# Patient Record
Sex: Male | Born: 1960 | Hispanic: Yes | Marital: Married | Smoking: Former smoker
Health system: Southern US, Community
[De-identification: ages and names within clinical notes are randomized; demographics above are authoritative.]

## PROBLEM LIST (undated history)

## (undated) DIAGNOSIS — E785 Hyperlipidemia, unspecified: Secondary | ICD-10-CM

## (undated) DIAGNOSIS — R569 Unspecified convulsions: Secondary | ICD-10-CM

## (undated) DIAGNOSIS — Z5189 Encounter for other specified aftercare: Secondary | ICD-10-CM

## (undated) DIAGNOSIS — K5790 Diverticulosis of intestine, part unspecified, without perforation or abscess without bleeding: Secondary | ICD-10-CM

## (undated) DIAGNOSIS — I2510 Atherosclerotic heart disease of native coronary artery without angina pectoris: Secondary | ICD-10-CM

## (undated) DIAGNOSIS — E669 Obesity, unspecified: Secondary | ICD-10-CM

## (undated) DIAGNOSIS — F411 Generalized anxiety disorder: Secondary | ICD-10-CM

## (undated) DIAGNOSIS — T7840XA Allergy, unspecified, initial encounter: Secondary | ICD-10-CM

## (undated) DIAGNOSIS — R011 Cardiac murmur, unspecified: Secondary | ICD-10-CM

## (undated) DIAGNOSIS — U071 COVID-19: Secondary | ICD-10-CM

## (undated) DIAGNOSIS — K219 Gastro-esophageal reflux disease without esophagitis: Secondary | ICD-10-CM

## (undated) DIAGNOSIS — S83249A Other tear of medial meniscus, current injury, unspecified knee, initial encounter: Secondary | ICD-10-CM

## (undated) DIAGNOSIS — F40240 Claustrophobia: Secondary | ICD-10-CM

## (undated) DIAGNOSIS — R399 Unspecified symptoms and signs involving the genitourinary system: Secondary | ICD-10-CM

## (undated) DIAGNOSIS — R7303 Prediabetes: Secondary | ICD-10-CM

## (undated) DIAGNOSIS — G5600 Carpal tunnel syndrome, unspecified upper limb: Secondary | ICD-10-CM

## (undated) HISTORY — DX: Gastro-esophageal reflux disease without esophagitis: K21.9

## (undated) HISTORY — DX: Generalized anxiety disorder: F41.1

## (undated) HISTORY — DX: Diverticulosis of intestine, part unspecified, without perforation or abscess without bleeding: K57.90

## (undated) HISTORY — DX: Cardiac murmur, unspecified: R01.1

## (undated) HISTORY — DX: Obesity, unspecified: E66.9

## (undated) HISTORY — DX: Unspecified convulsions: R56.9

## (undated) HISTORY — DX: Prediabetes: R73.03

## (undated) HISTORY — PX: CARPAL TUNNEL RELEASE: SHX101

## (undated) HISTORY — DX: Unspecified symptoms and signs involving the genitourinary system: R39.9

## (undated) HISTORY — DX: Carpal tunnel syndrome, unspecified upper limb: G56.00

## (undated) HISTORY — DX: Encounter for other specified aftercare: Z51.89

## (undated) HISTORY — DX: Hyperlipidemia, unspecified: E78.5

## (undated) HISTORY — DX: Other tear of medial meniscus, current injury, unspecified knee, initial encounter: S83.249A

## (undated) HISTORY — DX: Allergy, unspecified, initial encounter: T78.40XA

---
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
## 1984-10-23 HISTORY — PX: OTHER SURGICAL HISTORY: SHX169

## 1989-10-23 HISTORY — PX: SHOULDER SURGERY: SHX246

## 1992-10-23 DIAGNOSIS — Z5189 Encounter for other specified aftercare: Secondary | ICD-10-CM

## 1992-10-23 HISTORY — DX: Encounter for other specified aftercare: Z51.89

## 1992-10-23 HISTORY — PX: WRIST SURGERY: SHX841

## 2011-11-14 ENCOUNTER — Other Ambulatory Visit: Payer: Self-pay | Admitting: Occupational Medicine

## 2011-11-14 ENCOUNTER — Ambulatory Visit: Payer: Self-pay

## 2011-11-14 DIAGNOSIS — Z0000 Encounter for general adult medical examination without abnormal findings: Secondary | ICD-10-CM

## 2012-08-16 ENCOUNTER — Ambulatory Visit (INDEPENDENT_AMBULATORY_CARE_PROVIDER_SITE_OTHER): Payer: BC Managed Care – PPO | Admitting: Family

## 2012-08-16 ENCOUNTER — Ambulatory Visit: Payer: Self-pay | Admitting: Family

## 2012-08-16 ENCOUNTER — Encounter: Payer: Self-pay | Admitting: Family

## 2012-08-16 VITALS — BP 120/78 | HR 73 | Temp 97.8°F | Ht 67.0 in | Wt 188.0 lb

## 2012-08-16 DIAGNOSIS — R197 Diarrhea, unspecified: Secondary | ICD-10-CM

## 2012-08-16 DIAGNOSIS — M79605 Pain in left leg: Secondary | ICD-10-CM

## 2012-08-16 DIAGNOSIS — Z13220 Encounter for screening for lipoid disorders: Secondary | ICD-10-CM

## 2012-08-16 DIAGNOSIS — Z23 Encounter for immunization: Secondary | ICD-10-CM

## 2012-08-16 DIAGNOSIS — M79609 Pain in unspecified limb: Secondary | ICD-10-CM

## 2012-08-16 DIAGNOSIS — M79604 Pain in right leg: Secondary | ICD-10-CM

## 2012-08-16 LAB — CBC WITH DIFFERENTIAL/PLATELET
Basophils Absolute: 0 10*3/uL (ref 0.0–0.1)
Basophils Relative: 0.6 % (ref 0.0–3.0)
Eosinophils Absolute: 0.1 10*3/uL (ref 0.0–0.7)
Hemoglobin: 15 g/dL (ref 13.0–17.0)
Lymphocytes Relative: 43 % (ref 12.0–46.0)
Lymphs Abs: 2.2 10*3/uL (ref 0.7–4.0)
MCHC: 33 g/dL (ref 30.0–36.0)
MCV: 95 fl (ref 78.0–100.0)
Monocytes Absolute: 0.4 10*3/uL (ref 0.1–1.0)
Neutro Abs: 2.4 10*3/uL (ref 1.4–7.7)
RDW: 14.3 % (ref 11.5–14.6)

## 2012-08-16 LAB — COMPREHENSIVE METABOLIC PANEL
ALT: 22 U/L (ref 0–53)
AST: 18 U/L (ref 0–37)
Alkaline Phosphatase: 59 U/L (ref 39–117)
BUN: 17 mg/dL (ref 6–23)
Chloride: 104 mEq/L (ref 96–112)
Creatinine, Ser: 1 mg/dL (ref 0.4–1.5)
Total Bilirubin: 0.7 mg/dL (ref 0.3–1.2)

## 2012-08-16 LAB — LIPID PANEL
HDL: 44.1 mg/dL (ref >39.00)
Total CHOL/HDL Ratio: 6
Triglycerides: 150 mg/dL — ABNORMAL HIGH (ref 0.0–149.0)
VLDL: 30 mg/dL (ref 0.0–40.0)

## 2012-08-16 LAB — LDL CHOLESTEROL, DIRECT: Direct LDL: 199.5 mg/dL

## 2012-08-16 MED ORDER — DICLOFENAC SODIUM 75 MG PO TBEC: 75.0000 mg | DELAYED_RELEASE_TABLET | Freq: Two times a day (BID) | ORAL | Status: DC

## 2012-08-16 MED ORDER — METRONIDAZOLE 500 MG PO TABS: 500.0000 mg | ORAL_TABLET | Freq: Three times a day (TID) | ORAL | Status: DC

## 2012-08-16 NOTE — Patient Instructions (Addendum)
Diarrhea Infections caused by germs (bacterial) or a virus commonly cause diarrhea. Your caregiver has determined that with time, rest and fluids, the diarrhea should improve. In general, eat normally while drinking more water than usual. Although water may prevent dehydration, it does not contain salt and minerals (electrolytes). Broths, weak tea without caffeine and oral rehydration solutions (ORS) replace fluids and electrolytes. Small amounts of fluids should be taken frequently. Large amounts at one time may not be tolerated. Plain water may be harmful in infants and the elderly. Oral rehydrating solutions (ORS) are available at pharmacies and grocery stores. ORS replace water and important electrolytes in proper proportions. Sports drinks are not as effective as ORS and may be harmful due to sugars worsening diarrhea.  ORS is especially recommended for use in children with diarrhea. As a general guideline for children, replace any new fluid losses from diarrhea and/or vomiting with ORS as follows:  If your child weighs 22 pounds or under (10 kg or less), give 60-120 mL ( -  cup or 2 - 4 ounces) of ORS for each episode of diarrheal stool or vomiting episode.  If your child weighs more than 22 pounds (more than 10 kgs), give 120-240 mL ( - 1 cup or 4 - 8 ounces) of ORS for each diarrheal stool or episode of vomiting.  While correcting for dehydration, children should eat normally. However, foods high in sugar should be avoided because this may worsen diarrhea. Large amounts of carbonated soft drinks, juice, gelatin desserts and other highly sugared drinks should be avoided.  After correction of dehydration, other liquids that are appealing to the child may be added. Children should drink small amounts of fluids frequently and fluids should be increased as tolerated. Children should drink enough fluids to keep urine clear or pale yellow.  Adults should eat normally while drinking more fluids than  usual. Drink small amounts of fluids frequently and increase as tolerated. Drink enough fluids to keep urine clear or pale yellow. Broths, weak decaffeinated tea, lemon lime soft drinks (allowed to go flat) and ORS replace fluids and electrolytes.  Avoid:  Carbonated drinks.  Juice.  Extremely hot or cold fluids.  Caffeine drinks.  Fatty, greasy foods.  Alcohol.  Tobacco.  Too much intake of anything at one time.  Gelatin desserts.  Probiotics are active cultures of beneficial bacteria. They may lessen the amount and number of diarrheal stools in adults. Probiotics can be found in yogurt with active cultures and in supplements.  Wash hands well to avoid spreading bacteria and virus.  Anti-diarrheal medications are not recommended for infants and children.  Only take over-the-counter or prescription medicines for pain, discomfort or fever as directed by your caregiver. Do not give aspirin to children because it may cause Reye's Syndrome.  For adults, ask your caregiver if you should continue all prescribed and over-the-counter medicines.  If your caregiver has given you a follow-up appointment, it is very important to keep that appointment. Not keeping the appointment could result in a chronic or permanent injury, and disability. If there is any problem keeping the appointment, you must call back to this facility for assistance. SEEK IMMEDIATE MEDICAL CARE IF:   You or your child is unable to keep fluids down or other symptoms or problems become worse in spite of treatment.  Vomiting or diarrhea develops and becomes persistent.  There is vomiting of blood or bile (green material).  There is blood in the stool or the stools are black and   tarry.  There is no urine output in 6-8 hours or there is only a small amount of very dark urine.  Abdominal pain develops, increases or localizes.  You have a fever.  Your baby is older than 3 months with a rectal temperature of 102 F  (38.9 C) or higher.  Your baby is 3 months old or younger with a rectal temperature of 100.4 F (38 C) or higher.  You or your child develops excessive weakness, dizziness, fainting or extreme thirst.  You or your child develops a rash, stiff neck, severe headache or become irritable or sleepy and difficult to awaken. MAKE SURE YOU:   Understand these instructions.  Will watch your condition.  Will get help right away if you are not doing well or get worse. Document Released: 09/29/2002 Document Revised: 01/01/2012 Document Reviewed: 08/16/2009 [employer] Patient Information 2013 [employer].  

## 2012-08-16 NOTE — Progress Notes (Signed)
Subjective:    Patient ID: Ralph Phillips, male    DOB: [birth year], 51 y.o.   MRN: [id number]  HPI 51 year old Hispanic male, nonsmoker, new patient is in to be established. He has concerns of diarrhea and x1 week. Also reports bloating. Reports being in [place], [place], and [place]. Did not drink the water. However, didn't eat the country foods. Reports diarrhea accompanying by cramping that is relieved with defecation. Denies any blood in her stools are dark black stools. Previous physician gave him Cipro as a preventative measure to take when he is out of the country. He has not taken it at this point.  Patient is in requesting a referral to orthopedics. He typically runs about 3 miles every day. Over the last 2 months, he's had bilateral pain to his shins. He rates the pain a 6/10 has taken ibuprofen that helped some and use a Chinese oil. Pain has not resolved. Worse with running.   Review of Systems  Constitutional: Negative.   Respiratory: Negative.   Cardiovascular: Negative.   Gastrointestinal: Positive for diarrhea. Negative for nausea, vomiting, constipation, blood in stool and rectal pain.       Abdominal bloating  Musculoskeletal: Positive for myalgias.       Bilateral shin pain  Skin: Negative.   Neurological: Negative.   Hematological: Negative.   Psychiatric/Behavioral: Negative.    Past Medical History  Diagnosis Date  . Hyperlipidemia     History   Social History  . Marital Status: Unknown    Spouse Name: N/A    Number of Children: N/A  . Years of Education: N/A   Occupational History  . Not on file.   Social History Main Topics  . Smoking status: Former Games developer  . Smokeless tobacco: Not on file  . Alcohol Use: Yes     occassionally  . Drug Use: No  . Sexually Active: Not on file   Other Topics Concern  . Not on file   Social History Narrative  . No narrative on file    Past Surgical History  Procedure Date  . Appendectomy   . Wrist  surgery     left wrist and hand carpal reconstruction  . Buttock surgery     right buttock reconstruction  . Shoulder surgery     achromio clavicullar reconstruction    Family History  Problem Relation Age of Onset  . Arthritis Mother   . Hyperlipidemia Father   . Alzheimer's disease Father   . Arthritis Maternal Grandmother   . Stroke Maternal Grandfather   . Heart disease Paternal Grandmother     Not on File  No current outpatient prescriptions on file prior to visit.    BP 120/78  Pulse 73  Temp 97.8 F (36.6 C) (Oral)  Ht 5\' 7"  (1.702 m)  Wt 188 lb (85.276 kg)  BMI 29.44 kg/m2  SpO2 98%chart    Objective:   Physical Exam  Constitutional: He is oriented to person, place, and time. He appears well-developed and well-nourished.  HENT:  Right Ear: External ear normal.  Left Ear: External ear normal.  Nose: Nose normal.  Mouth/Throat: Oropharynx is clear and moist.  Neck: Normal range of motion. Neck supple.  Cardiovascular: Normal rate, regular rhythm and normal heart sounds.   Pulmonary/Chest: Effort normal and breath sounds normal.  Abdominal: Soft. Bowel sounds are normal. There is no tenderness. There is no rebound and no guarding.  Musculoskeletal: Normal range of motion. He exhibits tenderness.  Pain to palpation of the anterior aspect of the lower leg. No calf tenderness. Pulses 2 out of 2. No redness or rash.  Neurological: He is alert and oriented to person, place, and time. He has normal reflexes. No cranial nerve deficit.  Skin: Skin is warm and dry.  Psychiatric: He has a normal mood and affect.          Assessment & Plan:  Assessment: Diarrhea, bilateral leg pain  Plan: At patient's request, refer to orthopedics. In the meantime. Advise an anti-inflammatory, diclofenac twice a day with food. Will treat patient with Flagyl 500 mg one tablet 3 times a day for possible intestinal infection. Lab sent to include CBC and CMP. Patient also  requested to have lipid strong. He will return for complete physical exam.

## 2012-08-19 ENCOUNTER — Ambulatory Visit (INDEPENDENT_AMBULATORY_CARE_PROVIDER_SITE_OTHER): Payer: BC Managed Care – PPO | Admitting: Family

## 2012-08-19 ENCOUNTER — Encounter: Payer: Self-pay | Admitting: Family

## 2012-08-19 VITALS — BP 118/80 | HR 87 | Ht 67.0 in | Wt 188.0 lb

## 2012-08-19 DIAGNOSIS — Z0000 Encounter for general adult medical examination without abnormal findings: Secondary | ICD-10-CM

## 2012-08-19 DIAGNOSIS — E785 Hyperlipidemia, unspecified: Secondary | ICD-10-CM

## 2012-08-19 DIAGNOSIS — G4700 Insomnia, unspecified: Secondary | ICD-10-CM

## 2012-08-19 MED ORDER — SIMVASTATIN 20 MG PO TABS: 20.0000 mg | ORAL_TABLET | Freq: Every day | ORAL | Status: DC

## 2012-08-19 MED ORDER — TRAZODONE HCL 50 MG PO TABS: 50.0000 mg | ORAL_TABLET | Freq: Every evening | ORAL | Status: DC | PRN

## 2012-08-19 NOTE — Progress Notes (Signed)
Subjective:    Patient ID: Ralph Phillips, male    DOB: [birth year], 51 y.o.   MRN: [id number]  HPI Patient presents for yearly preventative medicine examination. All immunizations and health maintenance protocols were reviewed with the patient and they are up to date with these protocols. Screening laboratory values were reviewed with the patient including screening of hyperlipidemia PSA renal function and hepatic function. There medications past medical history social history problem list and allergies were reviewed in detail. Goals were established with regard to weight loss exercise diet in compliance with medications.   Review of Systems  Constitutional: Negative.   HENT: Negative.   Eyes: Negative.   Respiratory: Negative.   Cardiovascular: Negative.   Gastrointestinal: Negative.   Genitourinary: Negative.   Musculoskeletal: Negative.   Skin: Negative.   Neurological: Negative.   Hematological: Negative.   Psychiatric/Behavioral: Negative.    Past Medical History  Diagnosis Date  . Hyperlipidemia     History   Social History  . Marital Status: Unknown    Spouse Name: N/A    Number of Children: N/A  . Years of Education: N/A   Occupational History  . Not on file.   Social History Main Topics  . Smoking status: Former Games developer  . Smokeless tobacco: Not on file  . Alcohol Use: Yes     occassionally  . Drug Use: No  . Sexually Active: Not on file   Other Topics Concern  . Not on file   Social History Narrative  . No narrative on file    Past Surgical History  Procedure Date  . Appendectomy   . Wrist surgery     left wrist and hand carpal reconstruction  . Buttock surgery     right buttock reconstruction  . Shoulder surgery     achromio clavicullar reconstruction    Family History  Problem Relation Age of Onset  . Arthritis Mother   . Hyperlipidemia Father   . Alzheimer's disease Father   . Arthritis Maternal Grandmother   . Stroke Maternal  Grandfather   . Heart disease Paternal Grandmother     No Known Allergies  Current Outpatient Prescriptions on File Prior to Visit  Medication Sig Dispense Refill  . ciprofloxacin (CIPRO) 500 MG tablet Take 500 mg by mouth 2 (two) times daily.      . diclofenac (VOLTAREN) 75 MG EC tablet Take 1 tablet (75 mg total) by mouth 2 (two) times daily.  30 tablet  0  . metroNIDAZOLE (FLAGYL) 500 MG tablet Take 1 tablet (500 mg total) by mouth 3 (three) times daily.  21 tablet  0  . Multiple Vitamin (MULTIVITAMIN) tablet Take 1 tablet by mouth daily.      . simvastatin (ZOCOR) 20 MG tablet Take 1 tablet (20 mg total) by mouth at bedtime.  30 tablet  3  . traZODone (DESYREL) 50 MG tablet Take 1-2 tablets (50-100 mg total) by mouth at bedtime as needed for sleep.  60 tablet  3    BP 118/80  Pulse 87  Ht 5\' 7"  (1.702 m)  Wt 188 lb (85.276 kg)  BMI 29.44 kg/m2  SpO2 98%chart    Objective:   Physical Exam  Constitutional: He is oriented to person, place, and time. He appears well-developed and well-nourished.  HENT:  Head: Normocephalic and atraumatic.  Right Ear: External ear normal.  Left Ear: External ear normal.  Nose: Nose normal.  Mouth/Throat: Oropharynx is clear and moist.  Eyes: Conjunctivae normal and EOM are  normal. Pupils are equal, round, and reactive to light.  Neck: Normal range of motion. Neck supple. No thyromegaly present.  Cardiovascular: Normal rate, regular rhythm, normal heart sounds and intact distal pulses.  Exam reveals no gallop and no friction rub.   No murmur heard. Pulmonary/Chest: Effort normal and breath sounds normal.  Abdominal: Soft. Bowel sounds are normal. There is no tenderness. There is no rebound and no guarding.  Genitourinary: Rectum normal, prostate normal and penis normal.  Musculoskeletal: Normal range of motion.  Neurological: He is alert and oriented to person, place, and time. He has normal reflexes. He displays normal reflexes. No cranial  nerve deficit. Coordination normal.  Skin: Skin is warm and dry.  Psychiatric: He has a normal mood and affect.     EKG: WNL      Assessment & Plan:  Assessment: Well-care Exam, Hypercholesterolemia, Insomnia  Plan: Start Simvastatin 20mg  in the evening. Trazodone 50-100mg  po qhs as needed for sleep. Exercise to improve HDL. Healthy sleeping habits. Recheck in 3 months and sooner as needed. PSA added to the labs.

## 2012-08-19 NOTE — Patient Instructions (Signed)
Insomnia Insomnia is frequent trouble falling and/or staying asleep. Insomnia can be a long term problem or a short term problem. Both are common. Insomnia can be a short term problem when the wakefulness is related to a certain stress or worry. Long term insomnia is often related to ongoing stress during waking hours and/or poor sleeping habits. Overtime, sleep deprivation itself can make the problem worse. Every little thing feels more severe because you are overtired and your ability to cope is decreased. CAUSES   Stress, anxiety, and depression.  Poor sleeping habits.  Distractions such as TV in the bedroom.  Naps close to bedtime.  Engaging in emotionally charged conversations before bed.  Technical reading before sleep.  Alcohol and other sedatives. They may make the problem worse. They can hurt normal sleep patterns and normal dream activity.  Stimulants such as caffeine for several hours prior to bedtime.  Pain syndromes and shortness of breath can cause insomnia.  Exercise late at night.  Changing time zones may cause sleeping problems (jet lag). It is sometimes helpful to have someone observe your sleeping patterns. They should look for periods of not breathing during the night (sleep apnea). They should also look to see how long those periods last. If you live alone or observers are uncertain, you can also be observed at a sleep clinic where your sleep patterns will be professionally monitored. Sleep apnea requires a checkup and treatment. Give your caregivers your medical history. Give your caregivers observations your family has made about your sleep.  SYMPTOMS   Not feeling rested in the morning.  Anxiety and restlessness at bedtime.  Difficulty falling and staying asleep. TREATMENT   Your caregiver may prescribe treatment for an underlying medical disorders. Your caregiver can give advice or help if you are using alcohol or other drugs for self-medication. Treatment  of underlying problems will usually eliminate insomnia problems.  Medications can be prescribed for short time use. They are generally not recommended for lengthy use.  Over-the-counter sleep medicines are not recommended for lengthy use. They can be habit forming.  You can promote easier sleeping by making lifestyle changes such as:  Using relaxation techniques that help with breathing and reduce muscle tension.  Exercising earlier in the day.  Changing your diet and the time of your last meal. No night time snacks.  Establish a regular time to go to bed.  Counseling can help with stressful problems and worry.  Soothing music and white noise may be helpful if there are background noises you cannot remove.  Stop tedious detailed work at least one hour before bedtime. HOME CARE INSTRUCTIONS   Keep a diary. Inform your caregiver about your progress. This includes any medication side effects. See your caregiver regularly. Take note of:  Times when you are asleep.  Times when you are awake during the night.  The quality of your sleep.  How you feel the next day. This information will help your caregiver care for you.  Get out of bed if you are still awake after 15 minutes. Read or do some quiet activity. Keep the lights down. Wait until you feel sleepy and go back to bed.  Keep regular sleeping and waking hours. Avoid naps.  Exercise regularly.  Avoid distractions at bedtime. Distractions include watching television or engaging in any intense or detailed activity like attempting to balance the household checkbook.  Develop a bedtime ritual. Keep a familiar routine of bathing, brushing your teeth, climbing into bed at the same   time each night, listening to soothing music. Routines increase the success of falling to sleep faster.  Use relaxation techniques. This can be using breathing and muscle tension release routines. It can also include visualizing peaceful scenes. You can  also help control troubling or intruding thoughts by keeping your mind occupied with boring or repetitive thoughts like the old concept of counting sheep. You can make it more creative like imagining planting one beautiful flower after another in your backyard garden.  During your day, work to eliminate stress. When this is not possible use some of the previous suggestions to help reduce the anxiety that accompanies stressful situations. MAKE SURE YOU:   Understand these instructions.  Will watch your condition.  Will get help right away if you are not doing well or get worse. Document Released: 10/06/2000 Document Revised: 01/01/2012 Document Reviewed: 11/06/2007 [facility] Patient Information 2013 [place], [place].    Hypercholesterolemia High Blood Cholesterol Cholesterol is a white, waxy, fat-like protein needed by your body in small amounts. The liver makes all the cholesterol you need. It is carried from the liver by the blood through the blood vessels. Deposits (plaque) may build up on blood vessel walls. This makes the arteries narrower and stiffer. Plaque increases the risk for heart attack and stroke. You cannot feel your cholesterol level even if it is very high. The only way to know is by a blood test to check your lipid (fats) levels. Once you know your cholesterol levels, you should keep a record of the test results. Work with your caregiver to to keep your levels in the desired range. WHAT THE RESULTS MEAN:  Total cholesterol is a rough measure of all the cholesterol in your blood.  LDL is the so-called bad cholesterol. This is the type that deposits cholesterol in the walls of the arteries. You want this level to be low.  HDL is the good cholesterol because it cleans the arteries and carries the LDL away. You want this level to be high.  Triglycerides are fat that the body can either burn for energy or store. High levels are closely linked to heart disease. DESIRED  LEVELS:  Total cholesterol below 200.  LDL below 100 for people at risk, below 70 for very high risk.  HDL above 50 is good, above 60 is best.  Triglycerides below 150. HOW TO LOWER YOUR CHOLESTEROL:  Diet.  Choose fish or white meat chicken and [place], roasted or baked. Limit fatty cuts of red meat, fried foods, and processed meats, such as sausage and lunch meat.  Eat lots of fresh fruits and vegetables. Choose whole grains, beans, pasta, potatoes and cereals.  Use only small amounts of olive, corn or canola oils. Avoid butter, mayonnaise, shortening or palm kernel oils. Avoid foods with trans-fats.  Use skim/nonfat milk and low-fat/nonfat yogurt and cheeses. Avoid whole milk, cream, ice cream, egg yolks and cheeses. Healthy desserts include angel food cake, gingersnaps, animal crackers, hard candy, popsicles, and low-fat/nonfat frozen yogurt. Avoid pastries, cakes, pies and cookies.  Exercise.  A regular program helps decrease LDL and raises HDL.  Helps with weight control.  Do things that increase your activity level like gardening, walking, or taking the stairs.  Medication.  May be prescribed by your caregiver to help lowering cholesterol and the risk for heart disease.  You may need medicine even if your levels are normal if you have several risk factors. HOME CARE INSTRUCTIONS   Follow your diet and exercise programs as suggested by  your caregiver.  Take medications as directed.  Have blood work done when your caregiver feels it is necessary. MAKE SURE YOU:   Understand these instructions.  Will watch your condition.  Will get help right away if you are not doing well or get worse. Document Released: 10/09/2005 Document Revised: 01/01/2012 Document Reviewed: 03/27/2007 [facility] Patient Information 2013 [place], [place].

## 2012-09-11 ENCOUNTER — Encounter: Payer: Self-pay | Admitting: Family Medicine

## 2012-09-11 ENCOUNTER — Ambulatory Visit (INDEPENDENT_AMBULATORY_CARE_PROVIDER_SITE_OTHER): Payer: BC Managed Care – PPO | Admitting: Family Medicine

## 2012-09-11 VITALS — BP 131/87 | HR 80 | Ht 67.0 in | Wt 188.0 lb

## 2012-09-11 DIAGNOSIS — S83249A Other tear of medial meniscus, current injury, unspecified knee, initial encounter: Secondary | ICD-10-CM

## 2012-09-11 DIAGNOSIS — IMO0002 Reserved for concepts with insufficient information to code with codable children: Secondary | ICD-10-CM

## 2012-09-11 DIAGNOSIS — M76829 Posterior tibial tendinitis, unspecified leg: Secondary | ICD-10-CM

## 2012-09-11 HISTORY — DX: Other tear of medial meniscus, current injury, unspecified knee, initial encounter: S83.249A

## 2012-09-11 NOTE — Patient Instructions (Addendum)
Thank you for coming in today. Try the insoles.  If we need to we can add those felt  Pads to the bottom of the insoles if you need more support. Wear the compression sleeve during and following (one hour) following activity.  Try running and see how it goes.  If it does not help please com back and be ready to run and develop the problem.  4 weeks or so.

## 2012-09-11 NOTE — Assessment & Plan Note (Signed)
I suspect the medial posterior swelling along the course of the posterior tibialis tendon which is normal appearing to today's exam.  However his arches have significantly collapsed resulted in increased workload on the posterior tibialis tendon.  I suspect he has worsening tenosynovitis with increasing running.  Plan: Arch support.  Will use sports and soles with scaphoid pads as needed. We'll followup in 3-4 weeks. If still symptomatic will have patient go run for several miles and come back for detailed exam included ultrasound.

## 2012-09-11 NOTE — Assessment & Plan Note (Signed)
Highly suspicious for medial meniscus tear.   Patient does not want to have surgery at this time therefore further diagnostic testing is likely not warranted.  Plan: Trial of compressive knee sleeve during and following activity with icing protocol.  Followup in 3-4 weeks

## 2012-09-11 NOTE — Progress Notes (Signed)
Ralph Phillips is a 51 y.o. male who presents to [facility] today for evaluate bilateral ankle pain and  left knee pain.  1) ankle pain: Patient has recently started training for a half marathon. He is running approximately 3-4 miles at a time.  After 2 miles of running he notes swelling of the medial aspect of his posterior calf associated with pain.  This prevents him from running more than 5 miles.  This is occurring bilaterally and has recently started with increasing training.  He denies any pain at rest locking catching radiating pain weakness or numbness.  He has tried compression stockings which have helped marginally.   2) left knee medial pain: Pain in the medial aspect of the knee for several years. This begun after a knee injury that was sustained playing American football several years ago.  He notes locking catching swelling following activity.  He denies any radiating pain weakness or numbness.  He is suspicious that he has a meniscus injury but does not want to have surgery on it yet.  He wonders if there's anything we can do to help his knee pain.    PMH reviewed. Otherwise healthy History  Substance Use Topics  . Smoking status: Former Smoker    Quit date: 10/23/2001  . Smokeless tobacco: Never Used  . Alcohol Use: Yes     Comment: occassionally   ROS as above otherwise neg   Exam:  BP 131/87  Pulse 80  Ht 5\' 7"  (1.702 m)  Wt 188 lb (85.276 kg)  BMI 29.44 kg/m2 Gen: Well NAD MSK: Left knee.  Normal-appearing no effusion.  Nontender throughout.  Range of motion 0-120 with zero patellar crepitations to extension Positive medial McMurray's test negative lateral test.  Negative valgus and varus stress. Lachman's is lax with end point.  Feet bilaterally:  At rest normal-appearing longitudinal arch however with standing the arch flattens and the ankles pronate slightly.  Additionally the feet externally rotate with standing. Normal ankle motion and strength with no  laxity.  Normal heel inversion to toe standing.  Gait: Mild externally rotated feet with running with a slightly broad-based (Skiers type) running form.

## 2012-11-29 ENCOUNTER — Ambulatory Visit (INDEPENDENT_AMBULATORY_CARE_PROVIDER_SITE_OTHER): Payer: BC Managed Care – PPO | Admitting: Family

## 2012-11-29 ENCOUNTER — Encounter: Payer: Self-pay | Admitting: Family

## 2012-11-29 VITALS — BP 108/70 | HR 83 | Temp 98.7°F | Wt 188.0 lb

## 2012-11-29 DIAGNOSIS — J309 Allergic rhinitis, unspecified: Secondary | ICD-10-CM

## 2012-11-29 DIAGNOSIS — K921 Melena: Secondary | ICD-10-CM

## 2012-11-29 DIAGNOSIS — R350 Frequency of micturition: Secondary | ICD-10-CM

## 2012-11-29 DIAGNOSIS — E7800 Pure hypercholesterolemia, unspecified: Secondary | ICD-10-CM

## 2012-11-29 LAB — POCT URINALYSIS DIPSTICK
Bilirubin, UA: NEGATIVE
Leukocytes, UA: NEGATIVE
Nitrite, UA: NEGATIVE
pH, UA: 5.5

## 2012-11-29 MED ORDER — FEXOFENADINE-PSEUDOEPHED ER 180-240 MG PO TB24: 1.0000 | ORAL_TABLET | Freq: Every day | ORAL | Status: AC

## 2012-11-29 NOTE — Progress Notes (Signed)
Subjective:    Patient ID: Ralph Phillips, male    DOB: Jan 06, 1961, 52 y.o.   MRN: [id number]  HPI 52 year old male, nonsmoker, is in today with c/o cough, fever, congestion, off and on x 2 months. Patient reports taking Cipro and Robitussin AC that never cleared her symptoms. Patient reports environment changes at work, black mold development.   Patient report having diarrhea, blood in his stools in October 2013. He reports having hairy green organisms in his stool. Had abdominal cramping that was not relieved with defecation. The symptoms have resolved. Continues to feel like he has a parasite. Has a family history of colon polyps but no colon cancer. Never had a colonoscopy.   C/o urinary frequency. Denies urgency, burning, back pain or abdominal pain. Reports an increase of water intake.    Review of Systems  Constitutional: Positive for fever.  HENT: Positive for congestion.   Respiratory: Positive for cough.   Cardiovascular: Negative.   Gastrointestinal: Positive for diarrhea.  Musculoskeletal: Negative.   Skin: Negative.   Psychiatric/Behavioral: Negative.    Past Medical History  Diagnosis Date  . Hyperlipidemia     History   Social History  . Marital Status: Unknown    Spouse Name: N/A    Number of Children: N/A  . Years of Education: N/A   Occupational History  . Not on file.   Social History Main Topics  . Smoking status: Former Smoker    Quit date: 10/23/2001  . Smokeless tobacco: Never Used  . Alcohol Use: Yes     Comment: occassionally  . Drug Use: No  . Sexually Active: Not on file   Other Topics Concern  . Not on file   Social History Narrative  . No narrative on file    Past Surgical History  Procedure Date  . Appendectomy   . Wrist surgery     left wrist and hand carpal reconstruction  . Buttock surgery     right buttock reconstruction  . Shoulder surgery     achromio clavicullar reconstruction    Family History  Problem  Relation Age of Onset  . Arthritis Mother   . Hyperlipidemia Father   . Alzheimer's disease Father   . Arthritis Maternal Grandmother   . Stroke Maternal Grandfather   . Heart disease Paternal Grandmother     No Known Allergies  Current Outpatient Prescriptions on File Prior to Visit  Medication Sig Dispense Refill  . diclofenac (VOLTAREN) 75 MG EC tablet Take 1 tablet (75 mg total) by mouth 2 (two) times daily.  30 tablet  0  . Multiple Vitamin (MULTIVITAMIN) tablet Take 1 tablet by mouth daily.      . simvastatin (ZOCOR) 20 MG tablet Take 1 tablet (20 mg total) by mouth at bedtime.  30 tablet  3  . traZODone (DESYREL) 50 MG tablet Take 1-2 tablets (50-100 mg total) by mouth at bedtime as needed for sleep.  60 tablet  3    BP 108/70  Pulse 83  Temp 98.7 F (37.1 C) (Oral)  Wt 188 lb (85.276 kg)  SpO2 98%chart    Objective:   Physical Exam  Constitutional: He is oriented to person, place, and time. He appears well-developed and well-nourished.  HENT:  Right Ear: External ear normal.  Left Ear: External ear normal.  Nose: Nose normal.  Mouth/Throat: Oropharynx is clear and moist.  Neck: Normal range of motion. Neck supple.  Cardiovascular: Normal rate, regular rhythm and normal heart sounds.  Pulmonary/Chest: Effort normal and breath sounds normal.  Musculoskeletal: Normal range of motion.  Neurological: He is alert and oriented to person, place, and time.  Skin: Skin is warm and dry.  Psychiatric: He has a normal mood and affect.          Assessment & Plan:  Assessment:  1. Allergic Rhinitis 2. Blood in Stool 3. Urinary Frequency  Plan:  Allegra-D 24 hour once daily. Refer to GI. Urine sent. Call the office if symptoms worsen or persist. Recheck as scheduled and as needed.

## 2012-11-29 NOTE — Patient Instructions (Addendum)
Hay Fever Hay fever is an allergic reaction to particles in the air. It cannot be passed from person to person. It cannot be cured, but it can be controlled. CAUSES  Hay fever is caused by something that triggers an allergic reaction (allergens). The following are examples of allergens:  Ragweed.  Feathers.  Animal dander.  Grass and tree pollens.  Cigarette smoke.  House dust.  Pollution. SYMPTOMS   Sneezing.  Runny or stuffy nose.  Tearing eyes.  Itchy eyes, nose, mouth, throat, skin, or other area.  Sore throat.  Headache.  Decreased sense of smell or taste. DIAGNOSIS Your caregiver will perform a physical exam and ask questions about the symptoms you are having.Allergy testing may be done to determine exactly what triggers your hay fever.  TREATMENT   Over-the-counter medicines may help symptoms. These include:  Antihistamines.  Decongestants. These may help with nasal congestion.  Your caregiver may prescribe medicines if over-the-counter medicines do not work.  Some people benefit from allergy shots when other medicines are not helpful. HOME CARE INSTRUCTIONS   Avoid the allergen that is causing your symptoms, if possible.  Take all medicine as told by your caregiver. SEEK MEDICAL CARE IF:   You have severe allergy symptoms and your current medicines are not helping.  Your treatment was working at one time, but you are now experiencing symptoms.  You have sinus congestion and pressure.  You develop a fever or headache.  You have thick nasal discharge.  You have asthma and have a worsening cough and wheezing. SEEK IMMEDIATE MEDICAL CARE IF:   You have swelling of your tongue or lips.  You have trouble breathing.  You feel lightheaded or like you are going to faint.  You have cold sweats.  You have a fever. Document Released: 10/09/2005 Document Revised: 01/01/2012 Document Reviewed: 01/04/2011 [facility] Patient Information 2013  [place], [place].   Colonoscopy A colonoscopy is an exam to evaluate your entire colon. In this exam, your colon is cleansed. A long fiberoptic tube is inserted through your rectum and into your colon. The fiberoptic scope (endoscope) is a long bundle of enclosed and very flexible fibers. These fibers transmit light to the area examined and send images from that area to your caregiver. Discomfort is usually minimal. You may be given a drug to help you sleep (sedative) during or prior to the procedure. This exam helps to detect lumps (tumors), polyps, inflammation, and areas of bleeding. Your caregiver may also take a small piece of tissue (biopsy) that will be examined under a microscope. LET YOUR CAREGIVER KNOW ABOUT:   Allergies to food or medicine.  Medicines taken, including vitamins, herbs, eyedrops, over-the-counter medicines, and creams.  Use of steroids (by mouth or creams).  Previous problems with anesthetics or numbing medicines.  History of bleeding problems or blood clots.  Previous surgery.  Other health problems, including diabetes and kidney problems.  Possibility of pregnancy, if this applies. BEFORE THE PROCEDURE   A clear liquid diet may be required for 2 days before the exam.  Ask your caregiver about changing or stopping your regular medications.  Liquid injections (enemas) or laxatives may be required.  A large amount of electrolyte solution may be given to you to drink over a short period of time. This solution is used to clean out your colon.  You should be present 60 minutes prior to your procedure or as directed by your caregiver. AFTER THE PROCEDURE   If you received a sedative or  pain relieving medication, you will need to arrange for someone to drive you home.  Occasionally, there is a little blood passed with the first bowel movement. Do not be concerned. FINDING OUT THE RESULTS OF YOUR TEST Not all test results are available during your visit. If your  test results are not back during the visit, make an appointment with your caregiver to find out the results. Do not assume everything is normal if you have not heard from your caregiver or the medical facility. It is important for you to follow up on all of your test results. HOME CARE INSTRUCTIONS   It is not unusual to pass moderate amounts of gas and experience mild abdominal cramping following the procedure. This is due to air being used to inflate your colon during the exam. Walking or a warm pack on your belly (abdomen) may help.  You may resume all normal meals and activities after sedatives and medicines have worn off.  Only take over-the-counter or prescription medicines for pain, discomfort, or fever as directed by your caregiver. Do not use aspirin or blood thinners if a biopsy was taken. Consult your caregiver for medicine usage if biopsies were taken. SEEK IMMEDIATE MEDICAL CARE IF:   You have a fever.  You pass large blood clots or fill a toilet with blood following the procedure. This may also occur 10 to 14 days following the procedure. This is more likely if a biopsy was taken.  You develop abdominal pain that keeps getting worse and cannot be relieved with medicine. Document Released: 10/06/2000 Document Revised: 01/01/2012 Document Reviewed: 05/21/2008 [facility] Patient Information 2013 [place], [place].

## 2012-12-30 ENCOUNTER — Other Ambulatory Visit: Payer: Self-pay | Admitting: Family

## 2013-03-03 ENCOUNTER — Other Ambulatory Visit: Payer: Self-pay | Admitting: Family

## 2013-04-15 ENCOUNTER — Encounter: Payer: Self-pay | Admitting: Family

## 2013-04-15 ENCOUNTER — Ambulatory Visit (INDEPENDENT_AMBULATORY_CARE_PROVIDER_SITE_OTHER): Payer: BC Managed Care – PPO | Admitting: Family

## 2013-04-15 VITALS — BP 100/60 | HR 78 | Wt 190.0 lb

## 2013-04-15 DIAGNOSIS — E785 Hyperlipidemia, unspecified: Secondary | ICD-10-CM

## 2013-04-15 DIAGNOSIS — G4700 Insomnia, unspecified: Secondary | ICD-10-CM

## 2013-04-15 DIAGNOSIS — R350 Frequency of micturition: Secondary | ICD-10-CM

## 2013-04-15 LAB — CBC WITH DIFFERENTIAL/PLATELET
Basophils Absolute: 0 10*3/uL (ref 0.0–0.1)
Basophils Relative: 0.5 % (ref 0.0–3.0)
Eosinophils Absolute: 0.1 10*3/uL (ref 0.0–0.7)
Hemoglobin: 14.3 g/dL (ref 13.0–17.0)
Lymphocytes Relative: 38.8 % (ref 12.0–46.0)
MCHC: 33.2 g/dL (ref 30.0–36.0)
MCV: 95.2 fl (ref 78.0–100.0)
Monocytes Absolute: 0.4 10*3/uL (ref 0.1–1.0)
Neutro Abs: 3 10*3/uL (ref 1.4–7.7)
RDW: 14.2 % (ref 11.5–14.6)

## 2013-04-15 LAB — COMPREHENSIVE METABOLIC PANEL
ALT: 22 U/L (ref 0–53)
AST: 18 U/L (ref 0–37)
CO2: 29 mEq/L (ref 19–32)
Calcium: 9.3 mg/dL (ref 8.4–10.5)
Chloride: 101 mEq/L (ref 96–112)
GFR: 76.11 mL/min (ref >60.00)
Sodium: 137 mEq/L (ref 135–145)
Total Bilirubin: 0.5 mg/dL (ref 0.3–1.2)
Total Protein: 6.9 g/dL (ref 6.0–8.3)

## 2013-04-15 LAB — TSH: TSH: 1.19 u[IU]/mL (ref 0.35–5.50)

## 2013-04-15 LAB — LIPID PANEL: Total CHOL/HDL Ratio: 4

## 2013-04-15 LAB — GLUCOSE, POCT (MANUAL RESULT ENTRY): POC Glucose: 112 mg/dl — AB (ref 70–99)

## 2013-04-15 LAB — POCT URINALYSIS DIPSTICK
Blood, UA: NEGATIVE
Spec Grav, UA: 1.02
Urobilinogen, UA: 0.2

## 2013-04-15 NOTE — Progress Notes (Signed)
Subjective:    Patient ID: Ralph Phillips, male    DOB: Aug 01, 1961, 52 y.o.   MRN: [id number]  HPI 52 year old Hispanic male, nonsmoker is in today with complaints of urinary frequency x3-4 months. Patient reports he's noticed a higher volume and frequency of urine. Consumes approximately 2-1/2-3 L of water daily. Denies any burning with urination, blood in his urine or foul-smelling urine. He also feels more anxious and wound up recently. He is in the process of changing jobs. Denies any feelings of helplessness or hopelessness no thoughts of death or dying.   Review of Systems  Constitutional: Negative.   HENT: Negative.   Respiratory: Negative.   Endocrine: Positive for polyuria. Negative for polydipsia and polyphagia.  Genitourinary: Positive for urgency and frequency. Negative for decreased urine volume, discharge and penile pain.  Skin: Negative.   Allergic/Immunologic: Negative.   Neurological: Negative.   Hematological: Negative.   Psychiatric/Behavioral: Negative for self-injury and decreased concentration. The patient is nervous/anxious.    Past Medical History  Diagnosis Date  . Hyperlipidemia     History   Social History  . Marital Status: Married    Spouse Name: N/A    Number of Children: N/A  . Years of Education: N/A   Occupational History  . Not on file.   Social History Main Topics  . Smoking status: Former Smoker    Quit date: 10/23/2001  . Smokeless tobacco: Never Used  . Alcohol Use: Yes     Comment: occassionally  . Drug Use: No  . Sexually Active: Not on file   Other Topics Concern  . Not on file   Social History Narrative  . No narrative on file    Past Surgical History  Procedure Laterality Date  . Appendectomy    . Wrist surgery      left wrist and hand carpal reconstruction  . Buttock surgery      right buttock reconstruction  . Shoulder surgery      achromio clavicullar reconstruction    Family History  Problem  Relation Age of Onset  . Arthritis Mother   . Hyperlipidemia Father   . Alzheimer's disease Father   . Arthritis Maternal Grandmother   . Stroke Maternal Grandfather   . Heart disease Paternal Grandmother     No Known Allergies  Current Outpatient Prescriptions on File Prior to Visit  Medication Sig Dispense Refill  . simvastatin (ZOCOR) 20 MG tablet TAKE 1 TABLET (20 MG TOTAL) BY MOUTH AT BEDTIME.  30 tablet  3  . traZODone (DESYREL) 50 MG tablet TAKE 1-2 TABLETS (50-100 MG TOTAL) BY MOUTH AT BEDTIME AS NEEDED FOR SLEEP.  60 tablet  3  . diclofenac (VOLTAREN) 75 MG EC tablet Take 1 tablet (75 mg total) by mouth 2 (two) times daily.  30 tablet  0  . fexofenadine-pseudoephedrine (ALLEGRA-D 24 HOUR) 180-240 MG per 24 hr tablet Take 1 tablet by mouth daily.  30 tablet  3  . Multiple Vitamin (MULTIVITAMIN) tablet Take 1 tablet by mouth daily.       No current facility-administered medications on file prior to visit.    BP 100/60  Pulse 78  Wt 190 lb (86.183 kg)  BMI 29.75 kg/m2  SpO2 98%chart    Objective:   Physical Exam  Constitutional: He is oriented to person, place, and time. He appears well-developed and well-nourished.  HENT:  Right Ear: External ear normal.  Left Ear: External ear normal.  Nose: Nose normal.  Mouth/Throat: Oropharynx is clear  and moist.  Neck: Normal range of motion. Neck supple. No thyromegaly present.  Cardiovascular: Normal rate, regular rhythm and normal heart sounds.   Pulmonary/Chest: Effort normal and breath sounds normal.  Abdominal: Soft. Bowel sounds are normal.  Genitourinary: Rectum normal and prostate normal.  Musculoskeletal: Normal range of motion.  Neurological: He is alert and oriented to person, place, and time.  Skin: Skin is warm and dry.  Psychiatric: He has a normal mood and affect.          Assessment & Plan:  Assessment:  1. Urinary frequency 2. Hyperlipidemia 3. Insomnia  Plan: Lab sent to include lipids, TSH,  CMP, UA, and PSA will notify patient pending results. We'll discuss further treatment plan thereafter. Possible overactive bladder.

## 2013-04-15 NOTE — Patient Instructions (Addendum)
Urinary Frequency °The number of times a normal person urinates depends upon how much liquid they take in and how much liquid they are losing. If the temperature is hot and there is high humidity then the person will sweat more and usually breathe a little more frequently. These factors decrease the amount of frequency of urination that would be considered normal. °The amount you drink is easily determined, but the amount of fluid lost is sometimes more difficult to calculate.  °Fluid is lost in two ways: °· Sensible fluid loss is usually measured by the amount of urine that you get rid of. Losses of fluid can also occur with diarrhea. °· Insensible fluid loss is more difficult to measure. It is caused by evaporation. Insensible loss of fluid occurs through breathing and sweating. It usually ranges from a little less than a quart to a little more than a quart of fluid a day. °In normal temperatures and activity levels the average person may urinate 4 to 7 times in a 24-hour period. Needing to urinate more often than that could indicate a problem. If one urinates 4 to 7 times in 24 hours and has large volumes each time, that could indicate a different problem from one who urinates 4 to 7 times a day and has small volumes. The time of urinating is also an important. Most urinating should be done during the waking hours. Getting up at night to urinate frequently can indicate some problems. °CAUSES  °The bladder is the organ in your lower abdomen that holds urine. Like a balloon, it swells some as it fills up. Your nerves sense this and tell you it is time to head for the bathroom. There are a number of reasons that you might feel the need to urinate more often than usual. They include: °· Urinary tract infection. This is usually associated with other signs such as burning when you urinate. °· In men, problems with the prostate (a walnut-size gland that is located near the tube that carries urine out of your body).  There are two reasons why the prostate can cause an increased frequency of urination: °· An enlarged prostate that does not let the bladder empty well. If the bladder only half empties when you urinate then it only has half the capacity to fill before you have to urinate again. °· The nerves in the bladder become more hypersensitive with an increased size of the prostate even if the bladder empties completely. °· Pregnancy. °· Obesity. Excess weight is more likely to cause a problem for women more than for men. °· Bladder stones or other bladder problems. °· Caffeine. °· Alcohol. °· Medications. For example, drugs that help the body get rid of extra fluid (diuretics) increase urine production. Some other medicines must be taken with lots of fluids. °· Muscle or nerve weakness. This might be the result of a spinal cord injury, a stroke, multiple sclerosis or Parkinson's disease. °· Long-standing diabetes can decrease the sensation of the bladder. This loss of sensation makes it harder to sense the bladder needs to be emptied. Over a period of years the bladder is stretched out by constant overfilling. This weakens the bladder muscles so that the bladder does not empty well and has less capacity to fill with new urine. °· Interstitial cystitis (also called painful bladder syndrome). This condition develops because the tissues that line the insider of the bladder are inflamed (inflammation is the body's way of reacting to injury or infection). It causes pain   and frequent urination. It occurs in women more often than in men. °DIAGNOSIS  °· To decide what might be causing your urinary frequency, your healthcare provider will probably: °· Ask about symptoms you have noticed. °· Ask about your overall health. This will include questions about any medications you are taking. °· Do a physical examination. °· Order some tests. These might include: °· A blood test to check for diabetes or other health issues that could be  contributing to the problem. °· Urine testing. This could measure the flow of urine and the pressure on the bladder. °· A test of your neurological system (the brain, spinal cord and nerves). This is the system that senses the need to urinate. °· A bladder test to check whether it is emptying completely when you urinate. °· Cytoscopy. This test uses a thin tube with a tiny camera on it. It offers a look inside your urethra and bladder to see if there are problems. °· Imaging tests. You might be given a contrast dye and then asked to urinate. X-rays are taken to see how your bladder is working. °TREATMENT  °It is important for you to be evaluated to determine if the amount or frequency that you have is unusual or abnormal. If it is found to be abnormal the cause should be determined and this can usually be found out easily. Depending upon the cause treatment could include medication, stimulation of the nerves, or surgery. °There are not too many things that you can do as an individual to change your urinary frequency. It is important that you balance the amount of fluid intake needed to compensate for your activity and the temperature. Medical problems will be diagnosed and taken care of by your physician. There is no particular bladder training such as Kegel's exercises that you can do to help urinary frequency. This is an exercise this is usually done for people who have leaking of urine when they laugh cough or sneeze. °HOME CARE INSTRUCTIONS  °· Take any medications your healthcare provider prescribed or suggested. Follow the directions carefully. °· Practice any lifestyle changes that are recommended. These might include: °· Drinking less fluid or drinking at different times of the day. If you need to urinate often during the night, for example, you may need to stop drinking fluids early in the evening. °· Cutting down on caffeine or alcohol. They both can make you need to urinate more often than normal.  Caffeine is found in coffee, tea and sodas. °· Losing weight, if that is recommended. °· Keep a journal or a log. You might be asked to record how much you drink and when and when you feel the need to urinate. This will also help evaluate how well the treatment provided by your physician is working. °SEEK MEDICAL CARE IF:  °· Your need to urinate often gets worse. °· You feel increased pain or irritation when you urinate. °· You notice blood in your urine. °· You have questions about any medications that your healthcare provider recommended. °· You notice blood, pus or swelling at the site of any test or treatment procedure. °· You develop a fever of more than 100.5° F (38.1° C). °SEEK IMMEDIATE MEDICAL CARE IF:  °You develop a fever of more than 102.0° F (38.9° C). °Document Released: 08/05/2009 Document Revised: 01/01/2012 Document Reviewed: 08/05/2009 °[employer]® Patient Information ©2014 [employer]. ° °

## 2013-04-16 ENCOUNTER — Ambulatory Visit: Payer: BC Managed Care – PPO

## 2013-04-16 DIAGNOSIS — R7301 Impaired fasting glucose: Secondary | ICD-10-CM

## 2013-04-16 DIAGNOSIS — E291 Testicular hypofunction: Secondary | ICD-10-CM

## 2013-04-16 LAB — TESTOSTERONE: Testosterone: 224.8 ng/dL — ABNORMAL LOW (ref 350.00–890.00)

## 2013-04-17 ENCOUNTER — Encounter: Payer: Self-pay | Admitting: Family

## 2013-04-18 ENCOUNTER — Other Ambulatory Visit: Payer: Self-pay | Admitting: Family

## 2013-04-18 MED ORDER — TESTOSTERONE CYPIONATE 200 MG/ML IM SOLN: 200.0000 mg | INTRAMUSCULAR | Status: DC

## 2013-04-21 ENCOUNTER — Encounter: Payer: Self-pay | Admitting: Family Medicine

## 2013-06-02 ENCOUNTER — Other Ambulatory Visit: Payer: Self-pay

## 2013-06-02 MED ORDER — SIMVASTATIN 20 MG PO TABS: ORAL_TABLET | ORAL | Status: DC

## 2013-08-28 ENCOUNTER — Other Ambulatory Visit: Payer: Self-pay

## 2013-09-09 ENCOUNTER — Encounter: Payer: Self-pay | Admitting: Family

## 2013-09-09 ENCOUNTER — Ambulatory Visit (INDEPENDENT_AMBULATORY_CARE_PROVIDER_SITE_OTHER): Payer: 59 | Admitting: Family

## 2013-09-09 VITALS — BP 108/78 | HR 78 | Wt 198.0 lb

## 2013-09-09 DIAGNOSIS — M159 Polyosteoarthritis, unspecified: Secondary | ICD-10-CM

## 2013-09-09 DIAGNOSIS — E291 Testicular hypofunction: Secondary | ICD-10-CM

## 2013-09-09 DIAGNOSIS — E7800 Pure hypercholesterolemia, unspecified: Secondary | ICD-10-CM

## 2013-09-09 DIAGNOSIS — R739 Hyperglycemia, unspecified: Secondary | ICD-10-CM

## 2013-09-09 DIAGNOSIS — R7309 Other abnormal glucose: Secondary | ICD-10-CM

## 2013-09-09 DIAGNOSIS — R7303 Prediabetes: Secondary | ICD-10-CM | POA: Insufficient documentation

## 2013-09-09 LAB — BASIC METABOLIC PANEL
CO2: 27 mEq/L (ref 19–32)
Calcium: 9.1 mg/dL (ref 8.4–10.5)
Chloride: 103 mEq/L (ref 96–112)
Creatinine, Ser: 0.9 mg/dL (ref 0.4–1.5)
Glucose, Bld: 90 mg/dL (ref 70–99)

## 2013-09-09 LAB — HEPATIC FUNCTION PANEL
ALT: 24 U/L (ref 0–53)
AST: 18 U/L (ref 0–37)
Albumin: 4 g/dL (ref 3.5–5.2)
Bilirubin, Direct: 0 mg/dL (ref 0.0–0.3)
Total Protein: 6.9 g/dL (ref 6.0–8.3)

## 2013-09-09 NOTE — Patient Instructions (Signed)
Osteoarthritis Osteoarthritis is the most common form of arthritis. It is redness, soreness, and swelling (inflammation) affecting the cartilage. Cartilage acts as a cushion, covering the ends of bones where they meet to form a joint. CAUSES  Over time, the cartilage begins to wear away. This causes bone to rub on bone. This produces pain and stiffness in the affected joints. Factors that contribute to this problem are:  Excessive body weight.  Age.  Overuse of joints. SYMPTOMS   People with osteoarthritis usually experience joint pain, swelling, or stiffness.  Over time, the joint may lose its normal shape.  Small deposits of bone (osteophytes) may grow on the edges of the joint.  Bits of bone or cartilage can break off and float inside the joint space. This may cause more pain and damage.  Osteoarthritis can lead to depression, anxiety, feelings of helplessness, and limitations on daily activities. The most commonly affected joints are in the:  Ends of the fingers.  Thumbs.  Neck.  Lower back.  Knees.  Hips. DIAGNOSIS  Diagnosis is mostly based on your symptoms and exam. Tests may be helpful, including:  X-rays of the affected joint.  A computerized magnetic scan (MRI).  Blood tests to rule out other types of arthritis.  Joint fluid tests. This involves using a needle to draw fluid from the joint and examining the fluid under a microscope. TREATMENT  Goals of treatment are to control pain, improve joint function, maintain a normal body weight, and maintain a healthy lifestyle. Treatment approaches may include:  A prescribed exercise program with rest and joint relief.  Weight control with nutritional education.  Pain relief techniques such as:  Properly applied heat and cold.  Electric pulses delivered to nerve endings under the skin (transcutaneous electrical nerve stimulation, TENS).  Massage.  Certain supplements. Ask your caregiver before using any  supplements, especially in combination with prescribed drugs.  Medicines to control pain, such as:  Acetaminophen.  Nonsteroidal anti-inflammatory drugs (NSAIDs), such as naproxen.  Narcotic or central-acting agents, such as tramadol. This drug carries a risk of addiction and is generally prescribed for short-term use.  Corticosteroids. These can be given orally or as injection. This is a short-term treatment, not recommended for routine use.  Surgery to reposition the bones and relieve pain (osteotomy) or to remove loose pieces of bone and cartilage. Joint replacement may be needed in advanced states of osteoarthritis. HOME CARE INSTRUCTIONS  Your caregiver can recommend specific types of exercise. These may include:  Strengthening exercises. These are done to strengthen the muscles that support joints affected by arthritis. They can be performed with weights or with exercise bands to add resistance.  Aerobic activities. These are exercises, such as brisk walking or low-impact aerobics, that get your heart pumping. They can help keep your lungs and circulatory system in shape.  Range-of-motion activities. These keep your joints limber.  Balance and agility exercises. These help you maintain daily living skills. Learning about your condition and being actively involved in your care will help improve the course of your osteoarthritis. SEEK MEDICAL CARE IF:   You feel hot or your skin turns red.  You develop a rash in addition to your joint pain.  You have an oral temperature above 102 F (38.9 C). FOR MORE INFORMATION  National Institute of Arthritis and Musculoskeletal and Skin Diseases: www.niams.nih.gov National Institute on Aging: www.nia.nih.gov American College of Rheumatology: www.rheumatology.org Document Released: 10/09/2005 Document Revised: 01/01/2012 Document Reviewed: 01/20/2010 [employer] Patient Information 2014 [employer].  

## 2013-09-09 NOTE — Progress Notes (Signed)
Subjective:    Patient ID: Ralph Phillips, male    DOB: Mar 04, 1961, 52 y.o.   MRN: [id number]  HPI  52 year old Hispanic male, nonsmoker is in today for recheck of hyperglycemia, hypercholesterolemia, osteoarthritis, allergic rhinitis, and hypogonadism. Patient reports that his insurance ran out last time he was here therefore he did not get the medication for hypergonadism. He has not been exercising nor following a low cholesterol diet. Reports he travels a lot and eats a lot on the go. He is up 8 pounds his visit from his last office visit.  Patient also has concerns of joint pain in his left hand particularly, both knees, but the left is worse than the right. Pain is worse with cold weather. He responds well to warm water and heat. In the past has taken diclofenac but he discontinued because it made his stomach growl  Review of Systems  HENT: Negative.   Respiratory: Negative.   Cardiovascular: Negative.   Gastrointestinal: Negative.   Endocrine: Negative.   Genitourinary: Negative.   Musculoskeletal: Positive for arthralgias.       Joint pain to the left hand, left and right knee.  Neurological: Negative.   Psychiatric/Behavioral: Negative.    Past Medical History  Diagnosis Date  . Hyperlipidemia     History   Social History  . Marital Status: Married    Spouse Name: N/A    Number of Children: N/A  . Years of Education: N/A   Occupational History  . Not on file.   Social History Main Topics  . Smoking status: Former Smoker    Quit date: 10/23/2001  . Smokeless tobacco: Never Used  . Alcohol Use: Yes     Comment: occassionally  . Drug Use: No  . Sexual Activity: Not on file   Other Topics Concern  . Not on file   Social History Narrative  . No narrative on file    Past Surgical History  Procedure Laterality Date  . Appendectomy    . Wrist surgery      left wrist and hand carpal reconstruction  . Buttock surgery      right buttock reconstruction   . Shoulder surgery      achromio clavicullar reconstruction    Family History  Problem Relation Age of Onset  . Arthritis Mother   . Hyperlipidemia Father   . Alzheimer's disease Father   . Arthritis Maternal Grandmother   . Stroke Maternal Grandfather   . Heart disease Paternal Grandmother     No Known Allergies  Current Outpatient Prescriptions on File Prior to Visit  Medication Sig Dispense Refill  . fexofenadine-pseudoephedrine (ALLEGRA-D 24 HOUR) 180-240 MG per 24 hr tablet Take 1 tablet by mouth daily.  30 tablet  3  . Multiple Vitamin (MULTIVITAMIN) tablet Take 1 tablet by mouth daily.      . simvastatin (ZOCOR) 20 MG tablet TAKE 1 TABLET (20 MG TOTAL) BY MOUTH AT BEDTIME.  30 tablet  3  . diclofenac (VOLTAREN) 75 MG EC tablet Take 1 tablet (75 mg total) by mouth 2 (two) times daily.  30 tablet  0  . testosterone cypionate (DEPOTESTOTERONE CYPIONATE) 200 MG/ML injection Inject 1 mL (200 mg total) into the muscle every 14 (fourteen) days.  10 mL  0  . traZODone (DESYREL) 50 MG tablet TAKE 1-2 TABLETS (50-100 MG TOTAL) BY MOUTH AT BEDTIME AS NEEDED FOR SLEEP.  60 tablet  3   No current facility-administered medications on file prior to visit.  BP 108/78  Pulse 78  Wt 198 lb (89.812 kg)chart    Objective:   Physical Exam  Constitutional: He is oriented to person, place, and time. He appears well-developed and well-nourished.  HENT:  Right Ear: External ear normal.  Left Ear: External ear normal.  Nose: Nose normal.  Mouth/Throat: Oropharynx is clear and moist.  Neck: Normal range of motion. Neck supple. No thyromegaly present.  Cardiovascular: Normal rate and normal heart sounds.   Pulmonary/Chest: Effort normal and breath sounds normal.  Abdominal: Bowel sounds are normal.  Musculoskeletal: Normal range of motion. He exhibits no edema and no tenderness.  Neurological: He is oriented to person, place, and time.  Skin: Skin is warm and dry.  Psychiatric: He has a  normal mood and affect.          Assessment & Plan:  Assessment: 1. Hyperglycemia 2. Hypercholesterolemia 3. Osteoarthritis 4. Allergic rhinitis 5. Hypogonadism  Plan: Lab sent to include A1c, BMP, LFTs will notify patient of the results. Discuss further treatment plan at that time. Encouraged healthy diet, exercise, low cholesterol, low sugar diet. Consider glucosamine over-the-counter osteoarthritic pain. If symptoms persist will consider a nonsteroidal anti-inflammatory.

## 2013-09-10 ENCOUNTER — Other Ambulatory Visit: Payer: Self-pay | Admitting: Family

## 2013-09-10 MED ORDER — TRAZODONE HCL 50 MG PO TABS: ORAL_TABLET | ORAL | Status: DC

## 2013-09-10 MED ORDER — TESTOSTERONE CYPIONATE 200 MG/ML IM SOLN: 200.0000 mg | INTRAMUSCULAR | Status: DC

## 2013-09-10 MED ORDER — SIMVASTATIN 20 MG PO TABS: ORAL_TABLET | ORAL | Status: DC

## 2013-09-10 MED ORDER — DICLOFENAC SODIUM 75 MG PO TBEC: 75.0000 mg | DELAYED_RELEASE_TABLET | Freq: Two times a day (BID) | ORAL | Status: DC

## 2013-09-13 ENCOUNTER — Encounter: Payer: Self-pay | Admitting: Family

## 2013-09-23 ENCOUNTER — Ambulatory Visit (INDEPENDENT_AMBULATORY_CARE_PROVIDER_SITE_OTHER): Payer: 59

## 2013-09-23 DIAGNOSIS — E291 Testicular hypofunction: Secondary | ICD-10-CM

## 2013-09-23 DIAGNOSIS — R7989 Other specified abnormal findings of blood chemistry: Secondary | ICD-10-CM

## 2013-09-23 MED ORDER — TESTOSTERONE CYPIONATE 200 MG/ML IM SOLN
200.0000 mg | INTRAMUSCULAR | Status: DC
  Administered 2013-09-23: 200 mg via INTRAMUSCULAR

## 2013-10-07 ENCOUNTER — Ambulatory Visit (INDEPENDENT_AMBULATORY_CARE_PROVIDER_SITE_OTHER): Payer: 59

## 2013-10-07 DIAGNOSIS — R7989 Other specified abnormal findings of blood chemistry: Secondary | ICD-10-CM

## 2013-10-07 DIAGNOSIS — E291 Testicular hypofunction: Secondary | ICD-10-CM

## 2013-10-07 MED ORDER — TESTOSTERONE CYPIONATE 200 MG/ML IM SOLN
200.0000 mg | INTRAMUSCULAR | Status: DC
  Administered 2013-10-07: 200 mg via INTRAMUSCULAR

## 2013-10-21 ENCOUNTER — Ambulatory Visit (INDEPENDENT_AMBULATORY_CARE_PROVIDER_SITE_OTHER): Payer: 59

## 2013-10-21 DIAGNOSIS — R7989 Other specified abnormal findings of blood chemistry: Secondary | ICD-10-CM

## 2013-10-21 DIAGNOSIS — E291 Testicular hypofunction: Secondary | ICD-10-CM

## 2013-10-21 MED ORDER — TESTOSTERONE CYPIONATE 200 MG/ML IM SOLN
200.0000 mg | INTRAMUSCULAR | Status: DC
  Administered 2013-10-21: 200 mg via INTRAMUSCULAR

## 2013-10-31 ENCOUNTER — Ambulatory Visit (INDEPENDENT_AMBULATORY_CARE_PROVIDER_SITE_OTHER): Payer: 59

## 2013-10-31 DIAGNOSIS — E291 Testicular hypofunction: Secondary | ICD-10-CM

## 2013-10-31 MED ORDER — TESTOSTERONE CYPIONATE 200 MG/ML IM SOLN
200.0000 mg | Freq: Once | INTRAMUSCULAR | Status: AC
  Administered 2013-10-31: 200 mg via INTRAMUSCULAR

## 2013-11-17 ENCOUNTER — Ambulatory Visit (INDEPENDENT_AMBULATORY_CARE_PROVIDER_SITE_OTHER): Payer: 59

## 2013-11-17 DIAGNOSIS — E291 Testicular hypofunction: Secondary | ICD-10-CM

## 2013-11-17 MED ORDER — TESTOSTERONE CYPIONATE 200 MG/ML IM SOLN
200.0000 mg | Freq: Once | INTRAMUSCULAR | Status: AC
  Administered 2013-11-17: 200 mg via INTRAMUSCULAR

## 2013-11-25 ENCOUNTER — Ambulatory Visit (INDEPENDENT_AMBULATORY_CARE_PROVIDER_SITE_OTHER): Payer: 59 | Admitting: Family

## 2013-11-25 ENCOUNTER — Encounter: Payer: Self-pay | Admitting: Family

## 2013-11-25 VITALS — BP 120/80 | HR 101 | Temp 98.7°F | Wt 199.0 lb

## 2013-11-25 DIAGNOSIS — J069 Acute upper respiratory infection, unspecified: Secondary | ICD-10-CM

## 2013-11-25 DIAGNOSIS — R5383 Other fatigue: Secondary | ICD-10-CM

## 2013-11-25 DIAGNOSIS — R5381 Other malaise: Secondary | ICD-10-CM

## 2013-11-25 MED ORDER — AMOXICILLIN-POT CLAVULANATE 875-125 MG PO TABS: 1.0000 | ORAL_TABLET | Freq: Two times a day (BID) | ORAL | Status: DC

## 2013-11-25 MED ORDER — METHYLPREDNISOLONE 4 MG PO KIT: PACK | ORAL | Status: AC

## 2013-11-25 NOTE — Progress Notes (Signed)
Pre visit review using our clinic review tool, if applicable. No additional management support is needed unless otherwise documented below in the visit note. 

## 2013-11-25 NOTE — Progress Notes (Signed)
Subjective:    Patient ID: Ralph Phillips, male    DOB: [birth year], 53 y.o.   MRN: [id number]  HPI  53 year old Hispanic male, nonsmoker presenting with coughing, sneezing, sore throat, malaise, and night sweats. He went on business trip about two weeks ago to [place], [place], [place], [place], [place], and [place].  He said several persons he came in contact with had colds.  He has taken Theraflu, Naproxen, saline nasal spray, and used a humidifier at night. His wife is also sick with same symptoms.    Review of Systems  Constitutional: Positive for fever and fatigue.       Feels tired and night sweats  HENT: Positive for postnasal drip, sneezing and sore throat.        Sneezing, sore throat  and postnasal drip  Respiratory: Positive for cough.        Productive Cough.  Phlegm from green to dark gray.  Cardiovascular: Negative.   Musculoskeletal: Negative.   Skin: Negative.   Allergic/Immunologic: Negative.   Neurological: Positive for weakness.       Weakness and malaise  Psychiatric/Behavioral: Negative.    Past Medical History  Diagnosis Date  . Hyperlipidemia     History   Social History  . Marital Status: Married    Spouse Name: N/A    Number of Children: N/A  . Years of Education: N/A   Occupational History  . Not on file.   Social History Main Topics  . Smoking status: Former Smoker    Quit date: 10/23/2001  . Smokeless tobacco: Never Used  . Alcohol Use: Yes     Comment: occassionally  . Drug Use: No  . Sexual Activity: Not on file   Other Topics Concern  . Not on file   Social History Narrative  . No narrative on file    Past Surgical History  Procedure Laterality Date  . Appendectomy    . Wrist surgery      left wrist and hand carpal reconstruction  . Buttock surgery      right buttock reconstruction  . Shoulder surgery      achromio clavicullar reconstruction    Family History  Problem Relation Age of Onset  . Arthritis  Mother   . Hyperlipidemia Father   . Alzheimer's disease Father   . Arthritis Maternal Grandmother   . Stroke Maternal Grandfather   . Heart disease Paternal Grandmother     No Known Allergies  Current Outpatient Prescriptions on File Prior to Visit  Medication Sig Dispense Refill  . diclofenac (VOLTAREN) 75 MG EC tablet Take 1 tablet (75 mg total) by mouth 2 (two) times daily.  30 tablet  4  . fexofenadine-pseudoephedrine (ALLEGRA-D 24 HOUR) 180-240 MG per 24 hr tablet Take 1 tablet by mouth daily.  30 tablet  3  . Multiple Vitamin (MULTIVITAMIN) tablet Take 1 tablet by mouth daily.      . simvastatin (ZOCOR) 20 MG tablet TAKE 1 TABLET (20 MG TOTAL) BY MOUTH AT BEDTIME.  30 tablet  4  . testosterone cypionate (DEPOTESTOTERONE CYPIONATE) 200 MG/ML injection Inject 1 mL (200 mg total) into the muscle every 14 (fourteen) days.  10 mL  0  . traZODone (DESYREL) 50 MG tablet TAKE 1-2 TABLETS (50-100 MG TOTAL) BY MOUTH AT BEDTIME AS NEEDED FOR SLEEP.  60 tablet  4   No current facility-administered medications on file prior to visit.    BP 120/80  Pulse 101  Temp(Src) 98.7 F (37.1  C) (Oral)  Wt 199 lb (90.266 kg)chart    Objective:   Physical Exam  Constitutional: He is oriented to person, place, and time. He appears well-developed and well-nourished.  Night sweats  HENT:  Right Ear: External ear normal.  Left Ear: External ear normal.  Coughing, sneezing, nasal congestion,   Neck: Normal range of motion. Neck supple.  Swollen lymph nodes  Cardiovascular: Normal rate, regular rhythm and normal heart sounds.   Pulmonary/Chest: Effort normal and breath sounds normal.  Musculoskeletal: Normal range of motion.  Lymphadenopathy:    He has cervical adenopathy.  Neurological: He is alert and oriented to person, place, and time.  Weakness and malaise  Skin: Skin is warm and dry.  Psychiatric: He has a normal mood and affect.          Assessment & Plan:  Assessment 1. Upper  Respiratory Infection 2. Fatigue   Plan 1. Medrol Dosepak 21 tabs. 2. Augmentin 875 mg PO BID x 10 days.  3. Drink plenty of fluids. 4 Encourage strict handwashing techniques. 5. Contact office for problems or concerns.

## 2013-11-25 NOTE — Patient Instructions (Signed)

## 2013-12-09 ENCOUNTER — Ambulatory Visit: Payer: 59

## 2013-12-25 ENCOUNTER — Ambulatory Visit: Payer: 59

## 2013-12-25 ENCOUNTER — Ambulatory Visit (INDEPENDENT_AMBULATORY_CARE_PROVIDER_SITE_OTHER): Payer: 59

## 2013-12-25 DIAGNOSIS — E291 Testicular hypofunction: Secondary | ICD-10-CM

## 2013-12-25 MED ORDER — TESTOSTERONE CYPIONATE 200 MG/ML IM SOLN
200.0000 mg | Freq: Once | INTRAMUSCULAR | Status: AC
  Administered 2013-12-25: 200 mg via INTRAMUSCULAR

## 2014-01-14 ENCOUNTER — Ambulatory Visit: Payer: 59 | Admitting: Family

## 2014-01-15 ENCOUNTER — Ambulatory Visit (INDEPENDENT_AMBULATORY_CARE_PROVIDER_SITE_OTHER): Payer: 59

## 2014-01-15 DIAGNOSIS — E291 Testicular hypofunction: Secondary | ICD-10-CM

## 2014-01-15 MED ORDER — TESTOSTERONE CYPIONATE 200 MG/ML IM SOLN
200.0000 mg | INTRAMUSCULAR | Status: DC
  Administered 2014-01-15: 200 mg via INTRAMUSCULAR

## 2014-01-28 ENCOUNTER — Encounter: Payer: Self-pay | Admitting: Family

## 2014-01-28 ENCOUNTER — Ambulatory Visit (INDEPENDENT_AMBULATORY_CARE_PROVIDER_SITE_OTHER): Payer: 59 | Admitting: Family

## 2014-01-28 VITALS — BP 100/60 | HR 89 | Temp 98.6°F | Ht 67.0 in | Wt 191.0 lb

## 2014-01-28 DIAGNOSIS — Z0000 Encounter for general adult medical examination without abnormal findings: Secondary | ICD-10-CM

## 2014-01-28 DIAGNOSIS — E7800 Pure hypercholesterolemia, unspecified: Secondary | ICD-10-CM

## 2014-01-28 DIAGNOSIS — E291 Testicular hypofunction: Secondary | ICD-10-CM

## 2014-01-28 DIAGNOSIS — Z23 Encounter for immunization: Secondary | ICD-10-CM

## 2014-01-28 DIAGNOSIS — Z125 Encounter for screening for malignant neoplasm of prostate: Secondary | ICD-10-CM

## 2014-01-28 LAB — CBC WITH DIFFERENTIAL/PLATELET
Basophils Absolute: 0 10*3/uL (ref 0.0–0.1)
Basophils Relative: 0.6 % (ref 0.0–3.0)
Eosinophils Absolute: 0.1 10*3/uL (ref 0.0–0.7)
Eosinophils Relative: 1.2 % (ref 0.0–5.0)
HCT: 47.4 % (ref 39.0–52.0)
Hemoglobin: 15.8 g/dL (ref 13.0–17.0)
Lymphocytes Relative: 36.2 % (ref 12.0–46.0)
Lymphs Abs: 2.9 10*3/uL (ref 0.7–4.0)
MCHC: 33.3 g/dL (ref 30.0–36.0)
MCV: 93.4 fl (ref 78.0–100.0)
MONO ABS: 0.6 10*3/uL (ref 0.1–1.0)
Monocytes Relative: 7.3 % (ref 3.0–12.0)
NEUTROS PCT: 54.7 % (ref 43.0–77.0)
Neutro Abs: 4.3 10*3/uL (ref 1.4–7.7)
PLATELETS: 170 10*3/uL (ref 150.0–400.0)
RBC: 5.07 Mil/uL (ref 4.22–5.81)
RDW: 14.4 % (ref 11.5–14.6)
WBC: 7.9 10*3/uL (ref 4.5–10.5)

## 2014-01-28 LAB — COMPREHENSIVE METABOLIC PANEL
ALBUMIN: 4 g/dL (ref 3.5–5.2)
ALT: 26 U/L (ref 0–53)
AST: 24 U/L (ref 0–37)
Alkaline Phosphatase: 54 U/L (ref 39–117)
BUN: 13 mg/dL (ref 6–23)
CHLORIDE: 102 meq/L (ref 96–112)
CO2: 29 meq/L (ref 19–32)
Calcium: 9 mg/dL (ref 8.4–10.5)
Creatinine, Ser: 1 mg/dL (ref 0.4–1.5)
GFR: 87.85 mL/min (ref >60.00)
GLUCOSE: 77 mg/dL (ref 70–99)
POTASSIUM: 4 meq/L (ref 3.5–5.1)
Sodium: 137 mEq/L (ref 135–145)
TOTAL PROTEIN: 7 g/dL (ref 6.0–8.3)
Total Bilirubin: 0.6 mg/dL (ref 0.3–1.2)

## 2014-01-28 LAB — PSA: PSA: 1.62 ng/mL (ref 0.10–4.00)

## 2014-01-28 LAB — LIPID PANEL
CHOLESTEROL: 178 mg/dL (ref 0–200)
HDL: 50.4 mg/dL (ref >39.00)
LDL Cholesterol: 95 mg/dL (ref 0–99)
Total CHOL/HDL Ratio: 4
Triglycerides: 165 mg/dL — ABNORMAL HIGH (ref 0.0–149.0)
VLDL: 33 mg/dL (ref 0.0–40.0)

## 2014-01-28 LAB — TSH: TSH: 2.1 u[IU]/mL (ref 0.35–5.50)

## 2014-01-28 LAB — TESTOSTERONE: Testosterone: 1431.16 ng/dL — ABNORMAL HIGH (ref 350.00–890.00)

## 2014-01-28 NOTE — Progress Notes (Signed)
Pre visit review using our clinic review tool, if applicable. No additional management support is needed unless otherwise documented below in the visit note. 

## 2014-01-28 NOTE — Progress Notes (Signed)
Subjective:    Patient ID: Ralph Phillips, male    DOB: 1961/09/28, 53 y.o.   MRN: [id number]  HPI 53 year old Hispanic male, nonsmoker presenting today for an annual physical examination. This is a routine wellness  examination for this patient . I reviewed all health maintenance protocols including colonoscopy.  Needed referrals were placed. Age and diagnosis appropriate screening labs were ordered. His immunization history was reviewed and appropriate vaccinations were ordered. His current medications and allergies were reviewed and needed refills of her chronic medications were ordered. The plan for yearly health maintenance was discussed all orders and referrals were made as appropriate.    Review of Systems  Constitutional: Negative.   HENT: Negative.   Eyes: Negative.   Respiratory: Negative.   Cardiovascular: Negative.   Gastrointestinal: Negative.   Endocrine: Negative.   Genitourinary: Negative.   Musculoskeletal: Negative.   Skin: Negative.   Allergic/Immunologic: Negative.   Neurological: Negative.   Hematological: Negative.   Psychiatric/Behavioral: Negative.    Past Medical History  Diagnosis Date  . Hyperlipidemia     History   Social History  . Marital Status: Married    Spouse Name: N/A    Number of Children: N/A  . Years of Education: N/A   Occupational History  . Not on file.   Social History Main Topics  . Smoking status: Former Smoker    Quit date: 10/23/2001  . Smokeless tobacco: Never Used  . Alcohol Use: Yes     Comment: occassionally  . Drug Use: No  . Sexual Activity: Not on file   Other Topics Concern  . Not on file   Social History Narrative  . No narrative on file    Past Surgical History  Procedure Laterality Date  . Appendectomy    . Wrist surgery      left wrist and hand carpal reconstruction  . Buttock surgery      right buttock reconstruction  . Shoulder surgery      achromio clavicullar reconstruction     Family History  Problem Relation Age of Onset  . Arthritis Mother   . Hyperlipidemia Father   . Alzheimer's disease Father   . Arthritis Maternal Grandmother   . Stroke Maternal Grandfather   . Heart disease Paternal Grandmother     No Known Allergies  Current Outpatient Prescriptions on File Prior to Visit  Medication Sig Dispense Refill  . Multiple Vitamin (MULTIVITAMIN) tablet Take 1 tablet by mouth daily.      . simvastatin (ZOCOR) 20 MG tablet TAKE 1 TABLET (20 MG TOTAL) BY MOUTH AT BEDTIME.  30 tablet  4  . testosterone cypionate (DEPOTESTOTERONE CYPIONATE) 200 MG/ML injection Inject 1 mL (200 mg total) into the muscle every 14 (fourteen) days.  10 mL  0  . traZODone (DESYREL) 50 MG tablet TAKE 1-2 TABLETS (50-100 MG TOTAL) BY MOUTH AT BEDTIME AS NEEDED FOR SLEEP.  60 tablet  4   No current facility-administered medications on file prior to visit.    BP 100/60  Pulse 89  Temp(Src) 98.6 F (37 C) (Oral)  Ht 5\' 7"  (1.702 m)  Wt 191 lb (86.637 kg)  BMI 29.91 kg/m2  SpO2 99%chart    Objective:   Physical Exam  Constitutional: He is oriented to person, place, and time. He appears well-developed and well-nourished.  HENT:  Head: Normocephalic.  Right Ear: External ear normal.  Left Ear: External ear normal.  Eyes: Conjunctivae are normal. Pupils are equal, round, and  reactive to light.  Neck: Normal range of motion. Neck supple.  Cardiovascular: Normal rate, regular rhythm and normal heart sounds.   Pulmonary/Chest: Effort normal and breath sounds normal.  Abdominal: Soft. Bowel sounds are normal.  Genitourinary: Prostate normal. Guaiac negative stool.  Musculoskeletal: Normal range of motion.  Neurological: He is alert and oriented to person, place, and time.  Skin: Skin is warm and dry.  Psychiatric: He has a normal mood and affect. His behavior is normal. Judgment and thought content normal.          Assessment & Plan:  Assessment 1. Annual physical  Examination 2. Hypercholesteremia 3. Hypogonadism  Plan 1. Continue current medications.  2. Obtain labs that include CMP, Lipid, CBC, TSH, testosterone, and PSA.  3.Referral for colonoscopy. 4.Follow-up in six months.  5. Contact office for questions or concerns.

## 2014-01-28 NOTE — Patient Instructions (Signed)

## 2014-02-13 ENCOUNTER — Other Ambulatory Visit: Payer: Self-pay | Admitting: Family

## 2014-02-24 ENCOUNTER — Other Ambulatory Visit: Payer: Self-pay | Admitting: Family

## 2014-02-26 ENCOUNTER — Other Ambulatory Visit: Payer: Self-pay | Admitting: Family

## 2014-02-26 NOTE — Telephone Encounter (Signed)
Pt also needs trazodone. Pt stated pharm did not received rx for simvastatin. Please call pt at [phone] once rxs has been sent in

## 2014-03-02 ENCOUNTER — Encounter: Payer: Self-pay | Admitting: Family

## 2014-04-16 ENCOUNTER — Encounter: Payer: Self-pay | Admitting: Internal Medicine

## 2014-06-02 ENCOUNTER — Other Ambulatory Visit: Payer: Self-pay | Admitting: Family

## 2014-06-10 ENCOUNTER — Ambulatory Visit (AMBULATORY_SURGERY_CENTER): Payer: Self-pay | Admitting: *Deleted

## 2014-06-10 VITALS — Ht 67.0 in | Wt 189.2 lb

## 2014-06-10 DIAGNOSIS — Z1211 Encounter for screening for malignant neoplasm of colon: Secondary | ICD-10-CM

## 2014-06-10 MED ORDER — MOVIPREP 100 G PO SOLR: ORAL | Status: DC

## 2014-06-10 NOTE — Progress Notes (Signed)
No allergies to eggs or soy. No problems with anesthesia.  Pt given Emmi instructions for colonoscopy  No oxygen use  No diet drug use  

## 2014-06-24 ENCOUNTER — Encounter: Payer: Self-pay | Admitting: Internal Medicine

## 2014-06-24 ENCOUNTER — Ambulatory Visit (AMBULATORY_SURGERY_CENTER): Payer: 59 | Admitting: Internal Medicine

## 2014-06-24 VITALS — BP 127/63 | HR 69 | Temp 97.4°F | Resp 25 | Ht 67.0 in | Wt 189.0 lb

## 2014-06-24 DIAGNOSIS — Z1231 Encounter for screening mammogram for malignant neoplasm of breast: Secondary | ICD-10-CM

## 2014-06-24 DIAGNOSIS — Z1211 Encounter for screening for malignant neoplasm of colon: Secondary | ICD-10-CM

## 2014-06-24 MED ORDER — SODIUM CHLORIDE 0.9 % IV SOLN: 500.0000 mL | INTRAVENOUS | Status: DC

## 2014-06-24 NOTE — Progress Notes (Signed)
A/ox3, pleased with MAC, report to RN 

## 2014-06-24 NOTE — Op Note (Signed)
[place]  [address]. [place], [phone]   COLONOSCOPY PROCEDURE REPORT  PATIENT: Ralph, Phillips  MR#: [id number] BIRTHDATE: [birth year] , 53  yrs. old GENDER: Male ENDOSCOPIST: Eustace Quail, MD REFERRED DJ:MEQASTM Megan Salon, FNP-BC PROCEDURE DATE:  06/24/2014 PROCEDURE:   Colonoscopy, screening First Screening Colonoscopy - Avg.  risk and is 50 yrs.  old or older Yes.  Prior Negative Screening - Now for repeat screening. N/A  History of Adenoma - Now for follow-up colonoscopy & has been > or = to 3 yrs.  N/A  Polyps Removed Today? No.  Recommend repeat exam, <10 yrs? No. ASA CLASS:   Class II INDICATIONS:average risk screening. MEDICATIONS: MAC sedation, administered by CRNA and propofol (Diprivan) 250mg  IV  DESCRIPTION OF PROCEDURE:   After the risks benefits and alternatives of the procedure were thoroughly explained, informed consent was obtained.  A digital rectal exam revealed no abnormalities of the rectum.   The LB [id number]  endoscope was introduced through the anus and advanced to the cecum, which was identified by both the appendix and ileocecal valve. No adverse events experienced.   The quality of the prep was excellent, using MoviPrep  The instrument was then slowly withdrawn as the colon was fully examined.      COLON FINDINGS: Moderate diverticulosis was noted throughout the entire examined colon.   The colon was otherwise normal.  There was no  inflammation, polyps or cancers unless previously stated. Retroflexed views revealed internal hemorrhoids. The time to cecum=1 minutes 46 seconds.  Withdrawal time=10 minutes 13 seconds. The scope was withdrawn and the procedure completed.   COMPLICATIONS: There were no complications.  ENDOSCOPIC IMPRESSION: 1.   Moderate diverticulosis was noted throughout the entire examined colon 2.   The colon was otherwise normal  RECOMMENDATIONS: 1. Continue current colorectal  screening recommendations for "routine risk" patients with a repeat colonoscopy in 10 years.   eSigned:  Eustace Quail, MD 06/24/2014 10:01 AM   cc: The Patient and Carole Binning FNP

## 2014-06-24 NOTE — Progress Notes (Signed)
No problems noted in the recovery room. maw 

## 2014-06-24 NOTE — Patient Instructions (Signed)

## 2014-06-25 ENCOUNTER — Telehealth: Payer: Self-pay

## 2014-06-25 NOTE — Telephone Encounter (Signed)
  Follow up Call-  Call back number 06/24/2014  Post procedure Call Back phone  # [phone]  Permission to leave phone message Yes     Patient questions:  Do you have a fever, pain , or abdominal swelling? No. Pain Score  0 *  Have you tolerated food without any problems? Yes.    Have you been able to return to your normal activities? Yes.    Do you have any questions about your discharge instructions: Diet   No. Medications  No. Follow up visit  No.  Do you have questions or concerns about your Care? No.  Actions: * If pain score is 4 or above: No action needed, pain <4.  I spoke with the pt's wife and she said he did not have any problems. maw

## 2014-08-04 ENCOUNTER — Other Ambulatory Visit: Payer: Self-pay | Admitting: Family

## 2014-10-01 ENCOUNTER — Other Ambulatory Visit: Payer: Self-pay | Admitting: Family

## 2014-11-06 ENCOUNTER — Other Ambulatory Visit: Payer: Self-pay | Admitting: Family

## 2015-01-04 ENCOUNTER — Encounter: Payer: Self-pay | Admitting: Family Medicine

## 2015-01-04 ENCOUNTER — Ambulatory Visit (INDEPENDENT_AMBULATORY_CARE_PROVIDER_SITE_OTHER): Payer: BLUE CROSS/BLUE SHIELD | Admitting: Family Medicine

## 2015-01-04 VITALS — BP 120/72 | HR 90 | Temp 97.9°F | Wt 196.0 lb

## 2015-01-04 DIAGNOSIS — R58 Hemorrhage, not elsewhere classified: Secondary | ICD-10-CM

## 2015-01-04 NOTE — Progress Notes (Signed)
Pre visit review using our clinic review tool, if applicable. No additional management support is needed unless otherwise documented below in the visit note. 

## 2015-01-04 NOTE — Progress Notes (Deleted)
   Subjective:    Patient ID: Ralph Phillips, male    DOB: 1960-11-17, 54 y.o.   MRN: [id number]  HPI    Review of Systems     Objective:   Physical Exam        Assessment & Plan:

## 2015-01-04 NOTE — Patient Instructions (Signed)
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
## 2015-01-14 ENCOUNTER — Other Ambulatory Visit: Payer: Self-pay | Admitting: Family

## 2015-02-15 ENCOUNTER — Other Ambulatory Visit: Payer: Self-pay | Admitting: Family

## 2015-02-18 ENCOUNTER — Ambulatory Visit (INDEPENDENT_AMBULATORY_CARE_PROVIDER_SITE_OTHER): Payer: BLUE CROSS/BLUE SHIELD | Admitting: Family Medicine

## 2015-02-18 ENCOUNTER — Ambulatory Visit (HOSPITAL_BASED_OUTPATIENT_CLINIC_OR_DEPARTMENT_OTHER)
Admission: RE | Admit: 2015-02-18 | Discharge: 2015-02-18 | Disposition: A | Discharge status: Home / Self Care | Payer: BLUE CROSS/BLUE SHIELD | Source: Ambulatory Visit | Attending: Family Medicine | Admitting: Family Medicine

## 2015-02-18 VITALS — BP 118/80 | HR 101 | Ht 67.0 in | Wt 185.0 lb

## 2015-02-18 DIAGNOSIS — M25511 Pain in right shoulder: Secondary | ICD-10-CM

## 2015-02-18 DIAGNOSIS — M25562 Pain in left knee: Secondary | ICD-10-CM

## 2015-02-18 MED ORDER — PREDNISONE 10 MG PO TABS: ORAL_TABLET | ORAL | Status: DC

## 2015-02-18 NOTE — Patient Instructions (Signed)
Your right arm pain is due to a combination of issues: supraspinatus, pectoralis strains, radiculopathy (irritated nerve in your neck). Take prednisone as directed for 6 days. Day after finishing this you can go back to taking the naproxen/aleve. Do home exercises every day. Start physical therapy when you return from traveling for work.  Get x-rays of your knee as you leave today - we will call you with the results. These are the four classes of medicine you can take for this: Tylenol 500mg  1-2 tabs three times a day for pain. Aleve 1-2 tabs twice a day with food Glucosamine sulfate 750mg  twice a day is a supplement that may help. Capsaicin, aspercreme, or biofreeze topically up to four times a day may also help with pain. Cortisone injections are an option. Straight leg raises, knee extensions 3 sets of 10 once a day (add ankle weight if these become too easy). Inserts with good arch support may be helpful. Consider physical therapy to strengthen muscles around the joint that hurts to take pressure off of the joint itself. Heat or ice 15 minutes at a time 3-4 times a day as needed to help with pain. Follow up with me in 5-6 weeks.

## 2015-02-23 DIAGNOSIS — M25562 Pain in left knee: Secondary | ICD-10-CM | POA: Insufficient documentation

## 2015-02-23 DIAGNOSIS — M25511 Pain in right shoulder: Secondary | ICD-10-CM | POA: Insufficient documentation

## 2015-02-23 NOTE — Assessment & Plan Note (Signed)
history and exam point to multiple issues causing his symptoms - pectoralis, supraspinatus strains from overuse, probable mild radiculopathy.  Trial prednisone dose pack, start physical therapy and home exercises.  F/u in 5-6 weeks.
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
## 2015-02-23 NOTE — Assessment & Plan Note (Signed)
radiographs negative for DJD.  Dx with meniscus tear in past - may have flared this up again.  Discussed tylenol, nsaids, glucosamine, topical medications.  Shown home exercises to do daily.  Consider PT for this as well.  F/u in 5-6 weeks.

## 2015-03-01 ENCOUNTER — Telehealth: Payer: Self-pay | Admitting: Family Medicine

## 2015-03-01 NOTE — Telephone Encounter (Signed)
Had called the patient after x-rays were taken and left message to call our office. Patient called today. Gave results.

## 2015-03-30 ENCOUNTER — Ambulatory Visit (INDEPENDENT_AMBULATORY_CARE_PROVIDER_SITE_OTHER): Payer: BLUE CROSS/BLUE SHIELD | Admitting: Family Medicine

## 2015-03-30 ENCOUNTER — Encounter: Payer: Self-pay | Admitting: Family Medicine

## 2015-03-30 VITALS — BP 100/78 | HR 84 | Temp 98.3°F | Ht 66.25 in | Wt 195.3 lb

## 2015-03-30 DIAGNOSIS — E291 Testicular hypofunction: Secondary | ICD-10-CM | POA: Diagnosis not present

## 2015-03-30 DIAGNOSIS — R399 Unspecified symptoms and signs involving the genitourinary system: Secondary | ICD-10-CM | POA: Diagnosis not present

## 2015-03-30 DIAGNOSIS — M25562 Pain in left knee: Secondary | ICD-10-CM | POA: Diagnosis not present

## 2015-03-30 DIAGNOSIS — F39 Unspecified mood [affective] disorder: Secondary | ICD-10-CM

## 2015-03-30 DIAGNOSIS — Z6831 Body mass index (BMI) 31.0-31.9, adult: Secondary | ICD-10-CM

## 2015-03-30 DIAGNOSIS — M25511 Pain in right shoulder: Secondary | ICD-10-CM | POA: Diagnosis not present

## 2015-03-30 DIAGNOSIS — Z7189 Other specified counseling: Secondary | ICD-10-CM

## 2015-03-30 DIAGNOSIS — Z7689 Persons encountering health services in other specified circumstances: Secondary | ICD-10-CM

## 2015-03-30 NOTE — Progress Notes (Signed)
Pre visit review using our clinic review tool, if applicable. No additional management support is needed unless otherwise documented below in the visit note. 

## 2015-03-30 NOTE — Patient Instructions (Signed)
BEFORE YOU LEAVE: -schedule physical in 3-4 months - come fasting but drink plenty of water  -We placed a referral for you as discussed to the urologist. It usually takes about 1-2 weeks to process and schedule this referral. If you have not heard from [place] regarding this appointment in 2 weeks please contact our office.  -Follow up with your sports medicine doctor  We recommend the following healthy lifestyle measures: - eat a healthy diet consisting of lots of vegetables, fruits, beans, nuts, seeds, healthy meats such as white chicken and fish  - avoid starches, sweets, breads, tortilla, fried foods, fast food, processed foods, sodas, red meet and other fattening foods.  - get a least 150 minutes of aerobic exercise per week.
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
## 2015-05-24 ENCOUNTER — Other Ambulatory Visit: Payer: Self-pay | Admitting: Family

## 2015-07-27 ENCOUNTER — Ambulatory Visit (INDEPENDENT_AMBULATORY_CARE_PROVIDER_SITE_OTHER): Payer: BLUE CROSS/BLUE SHIELD | Admitting: Family Medicine

## 2015-07-27 ENCOUNTER — Encounter: Payer: Self-pay | Admitting: Family Medicine

## 2015-07-27 VITALS — BP 100/70 | HR 80 | Temp 97.7°F | Ht 66.0 in | Wt 190.4 lb

## 2015-07-27 DIAGNOSIS — R739 Hyperglycemia, unspecified: Secondary | ICD-10-CM

## 2015-07-27 DIAGNOSIS — Z23 Encounter for immunization: Secondary | ICD-10-CM | POA: Diagnosis not present

## 2015-07-27 DIAGNOSIS — Z0000 Encounter for general adult medical examination without abnormal findings: Secondary | ICD-10-CM

## 2015-07-27 DIAGNOSIS — E7800 Pure hypercholesterolemia, unspecified: Secondary | ICD-10-CM

## 2015-07-27 DIAGNOSIS — J309 Allergic rhinitis, unspecified: Secondary | ICD-10-CM | POA: Diagnosis not present

## 2015-07-27 LAB — LIPID PANEL
Cholesterol: 181 mg/dL (ref 0–200)
HDL: 60.2 mg/dL (ref >39.00)
LDL Cholesterol: 98 mg/dL (ref 0–99)
NONHDL: 120.77
Total CHOL/HDL Ratio: 3
Triglycerides: 114 mg/dL (ref 0.0–149.0)
VLDL: 22.8 mg/dL (ref 0.0–40.0)

## 2015-07-27 LAB — HEMOGLOBIN A1C: Hgb A1c MFr Bld: 5.9 % (ref 4.6–6.5)

## 2015-07-27 NOTE — Progress Notes (Signed)
Pre visit review using our clinic review tool, if applicable. No additional management support is needed unless otherwise documented below in the visit note. 
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
## 2015-07-27 NOTE — Patient Instructions (Signed)
BEFORE YOU LEAVE: -labs -flu shot -follow up in 1 year for annual physical  Taper off of the oxymetazoline nasal spray slowly. Use Zyrtec nightly and flonase 2 sprays each nostril daily for 1 month, then 1 spray each nostril daily.  We recommend the following healthy lifestyle measures: - eat a healthy whole foods diet consisting of regular small meals composed of vegetables, fruits, beans, nuts, seeds, healthy meats such as white chicken and fish and whole grains.  - avoid sweets, white starchy foods, fried foods, fast food, processed foods, sodas, red meet and other fattening foods.  - get a least 150-300 minutes of aerobic exercise per week.   -We have ordered labs or studies at this visit. It can take up to 1-2 weeks for results and processing. We will contact you with instructions IF your results are abnormal. Normal results will be released to your [facility]. If you have not heard from [place] or can not find your results in [facility] in 2 weeks please contact our office.

## 2015-07-28 LAB — HEPATITIS C ANTIBODY: HCV AB: NEGATIVE

## 2015-10-28 ENCOUNTER — Other Ambulatory Visit: Payer: Self-pay

## 2015-10-28 MED ORDER — SIMVASTATIN 20 MG PO TABS: 20.0000 mg | ORAL_TABLET | Freq: Every day | ORAL | Status: DC

## 2015-11-27 ENCOUNTER — Other Ambulatory Visit: Payer: Self-pay | Admitting: Family Medicine

## 2015-12-03 ENCOUNTER — Other Ambulatory Visit: Payer: Self-pay | Admitting: Family Medicine

## 2015-12-06 NOTE — Telephone Encounter (Signed)
I think this was already sent? Can you check?

## 2015-12-06 NOTE — Telephone Encounter (Signed)
I called the pharmacy and spoke with [facility] and she stated they did receive the Rx from 2/6 that was sent with 5 refills and to disregard the request from today.

## 2016-05-11 ENCOUNTER — Other Ambulatory Visit: Payer: Self-pay | Admitting: Family Medicine

## 2016-05-20 ENCOUNTER — Other Ambulatory Visit: Payer: Self-pay | Admitting: Family Medicine

## 2016-05-24 ENCOUNTER — Encounter: Payer: Self-pay | Admitting: Podiatry

## 2016-05-24 ENCOUNTER — Ambulatory Visit (INDEPENDENT_AMBULATORY_CARE_PROVIDER_SITE_OTHER): Payer: BLUE CROSS/BLUE SHIELD | Admitting: Podiatry

## 2016-05-24 VITALS — BP 127/65 | HR 76 | Resp 14

## 2016-05-24 DIAGNOSIS — D492 Neoplasm of unspecified behavior of bone, soft tissue, and skin: Secondary | ICD-10-CM | POA: Diagnosis not present

## 2016-05-24 DIAGNOSIS — L309 Dermatitis, unspecified: Secondary | ICD-10-CM

## 2016-05-24 DIAGNOSIS — L308 Other specified dermatitis: Secondary | ICD-10-CM | POA: Diagnosis not present
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
## 2016-05-25 ENCOUNTER — Telehealth: Payer: Self-pay | Admitting: *Deleted

## 2016-05-25 DIAGNOSIS — L309 Dermatitis, unspecified: Secondary | ICD-10-CM

## 2016-05-25 NOTE — Telephone Encounter (Addendum)
Left plantar foot skin wedge sent to [facility] for definitive diagnosis. 06/01/2016-Dr Prudence Davidson states pt's biopsy showed allergic dermatitis order lidex cream. Left message informing pt of the orders and to call with questions.

## 2016-06-01 ENCOUNTER — Ambulatory Visit: Payer: BLUE CROSS/BLUE SHIELD | Admitting: Podiatry

## 2016-06-01 MED ORDER — FLUOCINONIDE-E 0.05 % EX CREA: 1.0000 "application " | TOPICAL_CREAM | Freq: Two times a day (BID) | CUTANEOUS | 2 refills | Status: DC

## 2016-06-20 DIAGNOSIS — M25462 Effusion, left knee: Secondary | ICD-10-CM | POA: Diagnosis not present

## 2016-06-20 DIAGNOSIS — M25562 Pain in left knee: Secondary | ICD-10-CM | POA: Diagnosis not present

## 2016-06-20 DIAGNOSIS — M228X2 Other disorders of patella, left knee: Secondary | ICD-10-CM | POA: Diagnosis not present

## 2016-06-20 DIAGNOSIS — M2242 Chondromalacia patellae, left knee: Secondary | ICD-10-CM | POA: Diagnosis not present

## 2016-06-20 DIAGNOSIS — M7652 Patellar tendinitis, left knee: Secondary | ICD-10-CM | POA: Diagnosis not present

## 2016-07-03 ENCOUNTER — Other Ambulatory Visit: Payer: Self-pay | Admitting: *Deleted

## 2016-07-03 MED ORDER — SIMVASTATIN 20 MG PO TABS: 20.0000 mg | ORAL_TABLET | Freq: Every day | ORAL | 1 refills | Status: DC

## 2016-07-03 NOTE — Telephone Encounter (Signed)
Rx done. 

## 2016-07-12 DIAGNOSIS — M25562 Pain in left knee: Secondary | ICD-10-CM | POA: Diagnosis not present

## 2016-07-12 DIAGNOSIS — M2242 Chondromalacia patellae, left knee: Secondary | ICD-10-CM | POA: Diagnosis not present

## 2016-07-12 DIAGNOSIS — M25462 Effusion, left knee: Secondary | ICD-10-CM | POA: Diagnosis not present

## 2016-07-12 DIAGNOSIS — M7652 Patellar tendinitis, left knee: Secondary | ICD-10-CM | POA: Diagnosis not present

## 2016-07-24 DIAGNOSIS — M542 Cervicalgia: Secondary | ICD-10-CM | POA: Diagnosis not present

## 2016-07-27 ENCOUNTER — Ambulatory Visit (INDEPENDENT_AMBULATORY_CARE_PROVIDER_SITE_OTHER): Payer: BLUE CROSS/BLUE SHIELD | Admitting: Family Medicine

## 2016-07-27 ENCOUNTER — Encounter: Payer: Self-pay | Admitting: Family Medicine

## 2016-07-27 VITALS — Temp 98.1°F | Ht 66.0 in | Wt 182.4 lb

## 2016-07-27 DIAGNOSIS — Z6829 Body mass index (BMI) 29.0-29.9, adult: Secondary | ICD-10-CM

## 2016-07-27 DIAGNOSIS — Z23 Encounter for immunization: Secondary | ICD-10-CM | POA: Diagnosis not present

## 2016-07-27 DIAGNOSIS — R739 Hyperglycemia, unspecified: Secondary | ICD-10-CM

## 2016-07-27 DIAGNOSIS — Z0000 Encounter for general adult medical examination without abnormal findings: Secondary | ICD-10-CM | POA: Diagnosis not present

## 2016-07-27 DIAGNOSIS — E7800 Pure hypercholesterolemia, unspecified: Secondary | ICD-10-CM | POA: Diagnosis not present

## 2016-07-27 LAB — LIPID PANEL
CHOL/HDL RATIO: 2
Cholesterol: 173 mg/dL (ref 0–200)
HDL: 73.9 mg/dL (ref >39.00)
LDL Cholesterol: 81 mg/dL (ref 0–99)
NONHDL: 99.24
Triglycerides: 93 mg/dL (ref 0.0–149.0)
VLDL: 18.6 mg/dL (ref 0.0–40.0)

## 2016-07-27 LAB — HEMOGLOBIN A1C: HEMOGLOBIN A1C: 6.1 % (ref 4.6–6.5)

## 2016-07-27 MED ORDER — SIMVASTATIN 20 MG PO TABS: 20.0000 mg | ORAL_TABLET | Freq: Every day | ORAL | 3 refills | Status: DC
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
## 2016-07-27 NOTE — Patient Instructions (Signed)
BEFORE YOU LEAVE: -follow up: 4-6 months -flu shot -labs  We have ordered labs or studies at this visit. It can take up to 1-2 weeks for results and processing. IF results require follow up or explanation, we will call you with instructions. Clinically stable results will be released to your [facility]. If you have not heard from [place] or cannot find your results in [facility] in 2 weeks please contact our office at [phone].  If you are not yet signed up for [facility], please consider signing up.   We recommend the following healthy lifestyle for LIFE: 1) Small portions.   Tip: eat off of a salad plate instead of a dinner plate.  Tip: It is ok to feel hungry after a meal - that likely means you ate an appropriate portion.  Tip: if you need more or a snack choose fruits, veggies and/or a handful of nuts or seeds.  2) Eat a healthy clean diet.  * Tip: Avoid (less then 1 serving per week): processed foods, sweets, sweetened drinks, white starches (rice, flour, bread, potatoes, pasta, etc), red meat, fast foods, butter  *Tip: CHOOSE instead   * 5-9 servings per day of fresh or frozen fruits and vegetables (but not corn, potatoes, bananas, canned or dried fruit)   *nuts and seeds, beans   *olives and olive oil   *small portions of lean meats such as fish and white chicken    *small portions of whole grains  3)Get at least 150 minutes of sweaty aerobic exercise per week.  4)Reduce stress - consider counseling, meditation and relaxation to balance other aspects of your life.

## 2016-07-27 NOTE — Progress Notes (Signed)
Pre visit review using our clinic review tool, if applicable. No additional management support is needed unless otherwise documented below in the visit note. 

## 2016-08-01 DIAGNOSIS — M542 Cervicalgia: Secondary | ICD-10-CM | POA: Diagnosis not present

## 2016-08-04 DIAGNOSIS — M542 Cervicalgia: Secondary | ICD-10-CM | POA: Diagnosis not present

## 2016-08-14 DIAGNOSIS — M542 Cervicalgia: Secondary | ICD-10-CM | POA: Diagnosis not present

## 2016-08-22 DIAGNOSIS — M792 Neuralgia and neuritis, unspecified: Secondary | ICD-10-CM | POA: Diagnosis not present

## 2016-08-22 DIAGNOSIS — M50322 Other cervical disc degeneration at C5-C6 level: Secondary | ICD-10-CM | POA: Diagnosis not present

## 2016-08-22 DIAGNOSIS — M542 Cervicalgia: Secondary | ICD-10-CM | POA: Diagnosis not present

## 2016-08-22 DIAGNOSIS — M50222 Other cervical disc displacement at C5-C6 level: Secondary | ICD-10-CM | POA: Diagnosis not present

## 2016-08-29 ENCOUNTER — Other Ambulatory Visit: Payer: Self-pay | Admitting: Family Medicine

## 2016-10-23 HISTORY — PX: BICEPS TENDON REPAIR: SHX566

## 2016-11-21 ENCOUNTER — Other Ambulatory Visit: Payer: Self-pay | Admitting: *Deleted

## 2016-11-22 MED ORDER — TRAZODONE HCL 50 MG PO TABS: 50.0000 mg | ORAL_TABLET | Freq: Every evening | ORAL | 5 refills | Status: DC | PRN
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
## 2016-11-27 ENCOUNTER — Ambulatory Visit (INDEPENDENT_AMBULATORY_CARE_PROVIDER_SITE_OTHER): Payer: BLUE CROSS/BLUE SHIELD | Admitting: Family Medicine

## 2016-11-27 ENCOUNTER — Ambulatory Visit (INDEPENDENT_AMBULATORY_CARE_PROVIDER_SITE_OTHER)
Admission: RE | Admit: 2016-11-27 | Discharge: 2016-11-27 | Disposition: A | Discharge status: Home / Self Care | Payer: BLUE CROSS/BLUE SHIELD | Source: Ambulatory Visit | Attending: Family Medicine | Admitting: Family Medicine

## 2016-11-27 ENCOUNTER — Encounter: Payer: Self-pay | Admitting: Family Medicine

## 2016-11-27 VITALS — BP 118/78 | HR 67 | Temp 97.7°F | Ht 66.0 in | Wt 195.7 lb

## 2016-11-27 DIAGNOSIS — R042 Hemoptysis: Secondary | ICD-10-CM | POA: Diagnosis not present

## 2016-11-27 DIAGNOSIS — J0100 Acute maxillary sinusitis, unspecified: Secondary | ICD-10-CM | POA: Diagnosis not present

## 2016-11-27 DIAGNOSIS — R05 Cough: Secondary | ICD-10-CM | POA: Diagnosis not present

## 2016-11-27 DIAGNOSIS — F411 Generalized anxiety disorder: Secondary | ICD-10-CM | POA: Diagnosis not present

## 2016-11-27 DIAGNOSIS — R059 Cough, unspecified: Secondary | ICD-10-CM

## 2016-11-27 DIAGNOSIS — R739 Hyperglycemia, unspecified: Secondary | ICD-10-CM

## 2016-11-27 DIAGNOSIS — E7800 Pure hypercholesterolemia, unspecified: Secondary | ICD-10-CM

## 2016-11-27 MED ORDER — AMOXICILLIN-POT CLAVULANATE 875-125 MG PO TABS: 1.0000 | ORAL_TABLET | Freq: Two times a day (BID) | ORAL | 0 refills | Status: DC

## 2016-11-27 MED ORDER — PAROXETINE HCL 20 MG PO TABS: 20.0000 mg | ORAL_TABLET | Freq: Every day | ORAL | 3 refills | Status: DC

## 2016-11-27 NOTE — Patient Instructions (Signed)
BEFORE YOU LEAVE: -xray sheet -counseling brochure -follow up: in 1 month  Start the Augmentin for the sinus infection.  Get the chest xray today.  Wean off of the trazodone: 1 tablet nightly for 3-4 nights, then every other night for 3-4 nights.  AFTER weaning the trazodone - start the Paxil and take once daily in the morning.  Start counseling for insomnia and anxiety.  If you are not yet signed up for [facility], please SIGN UP TODAY. We now offer online scheduling, same day appointments and extended hours. WHEN YOU DON'T FEEL YOUR BEST.Marland KitchenMarland KitchenWE ARE HERE TO HELP.

## 2016-11-27 NOTE — Progress Notes (Signed)
Pre visit review using our clinic review tool, if applicable. No additional management support is needed unless otherwise documented below in the visit note. 

## 2016-12-26 ENCOUNTER — Ambulatory Visit: Payer: BLUE CROSS/BLUE SHIELD | Admitting: Family Medicine
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
## 2017-01-01 ENCOUNTER — Ambulatory Visit (INDEPENDENT_AMBULATORY_CARE_PROVIDER_SITE_OTHER): Payer: BLUE CROSS/BLUE SHIELD | Admitting: Family Medicine

## 2017-01-01 ENCOUNTER — Encounter: Payer: Self-pay | Admitting: Family Medicine

## 2017-01-01 VITALS — BP 112/80 | HR 79 | Temp 97.9°F | Ht 66.0 in | Wt 196.5 lb

## 2017-01-01 DIAGNOSIS — J309 Allergic rhinitis, unspecified: Secondary | ICD-10-CM | POA: Diagnosis not present

## 2017-01-01 DIAGNOSIS — G4700 Insomnia, unspecified: Secondary | ICD-10-CM | POA: Insufficient documentation

## 2017-01-01 DIAGNOSIS — F411 Generalized anxiety disorder: Secondary | ICD-10-CM

## 2017-01-01 MED ORDER — PAROXETINE HCL 20 MG PO TABS: 20.0000 mg | ORAL_TABLET | Freq: Every day | ORAL | 3 refills | Status: DC

## 2017-01-01 NOTE — Patient Instructions (Addendum)
BEFORE YOU LEAVE: -follow up: 3 - 4 months  Wean off of the afrin entirely. Use flonase and Milta Deiters Med saline in interim. Make sure to clean saline bottle appropriately and follow use instructions very carefully.  Continue the Paxil. You will never want to stop this medication suddenly. If you ever wish to stop this medication please set up and appointment so that we can taper off.

## 2017-01-01 NOTE — Progress Notes (Signed)
Pre visit review using our clinic review tool, if applicable. No additional management support is needed unless otherwise documented below in the visit note. 

## 2017-01-16 ENCOUNTER — Other Ambulatory Visit: Payer: Self-pay | Admitting: *Deleted

## 2017-01-16 MED ORDER — PAROXETINE HCL 20 MG PO TABS: 20.0000 mg | ORAL_TABLET | Freq: Every day | ORAL | 3 refills | Status: DC

## 2017-01-16 NOTE — Telephone Encounter (Signed)
Rx done. 

## 2017-02-13 DIAGNOSIS — M50222 Other cervical disc displacement at C5-C6 level: Secondary | ICD-10-CM | POA: Diagnosis not present

## 2017-02-25 ENCOUNTER — Encounter (HOSPITAL_BASED_OUTPATIENT_CLINIC_OR_DEPARTMENT_OTHER): Payer: Self-pay | Admitting: Emergency Medicine

## 2017-02-25 ENCOUNTER — Emergency Department (HOSPITAL_BASED_OUTPATIENT_CLINIC_OR_DEPARTMENT_OTHER): Payer: BLUE CROSS/BLUE SHIELD

## 2017-02-25 ENCOUNTER — Emergency Department (HOSPITAL_BASED_OUTPATIENT_CLINIC_OR_DEPARTMENT_OTHER)
Admission: EM | Admit: 2017-02-25 | Discharge: 2017-02-26 | Disposition: A | Discharge status: Home / Self Care | Payer: BLUE CROSS/BLUE SHIELD | Source: Home / Self Care | Attending: Emergency Medicine | Admitting: Emergency Medicine

## 2017-02-25 DIAGNOSIS — Z7982 Long term (current) use of aspirin: Secondary | ICD-10-CM | POA: Insufficient documentation

## 2017-02-25 DIAGNOSIS — W2102XA Struck by soccer ball, initial encounter: Secondary | ICD-10-CM

## 2017-02-25 DIAGNOSIS — S4992XA Unspecified injury of left shoulder and upper arm, initial encounter: Secondary | ICD-10-CM

## 2017-02-25 DIAGNOSIS — Z79899 Other long term (current) drug therapy: Secondary | ICD-10-CM | POA: Diagnosis not present

## 2017-02-25 DIAGNOSIS — Y9366 Activity, soccer: Secondary | ICD-10-CM

## 2017-02-25 DIAGNOSIS — S4992XS Unspecified injury of left shoulder and upper arm, sequela: Secondary | ICD-10-CM | POA: Diagnosis not present

## 2017-02-25 DIAGNOSIS — M7989 Other specified soft tissue disorders: Secondary | ICD-10-CM | POA: Diagnosis not present

## 2017-02-25 DIAGNOSIS — Y929 Unspecified place or not applicable: Secondary | ICD-10-CM | POA: Insufficient documentation

## 2017-02-25 DIAGNOSIS — S5002XA Contusion of left elbow, initial encounter: Secondary | ICD-10-CM

## 2017-02-25 DIAGNOSIS — M25512 Pain in left shoulder: Secondary | ICD-10-CM | POA: Diagnosis not present

## 2017-02-25 DIAGNOSIS — Y999 Unspecified external cause status: Secondary | ICD-10-CM | POA: Insufficient documentation

## 2017-02-25 DIAGNOSIS — Z87891 Personal history of nicotine dependence: Secondary | ICD-10-CM

## 2017-02-25 DIAGNOSIS — W2102XS Struck by soccer ball, sequela: Secondary | ICD-10-CM | POA: Diagnosis not present

## 2017-02-25 DIAGNOSIS — W19XXXA Unspecified fall, initial encounter: Secondary | ICD-10-CM

## 2017-02-25 MED ORDER — HYDROCODONE-ACETAMINOPHEN 5-325 MG PO TABS
1.0000 | ORAL_TABLET | Freq: Once | ORAL | Status: AC
  Administered 2017-02-25: 1 via ORAL
  Filled 2017-02-25: qty 1

## 2017-02-25 NOTE — ED Notes (Signed)
Alert, NAD, calm, interactive, resps e/u, speaking in clear complete sentences, no dyspnea noted, ambulatory to xray, steady gait. Family at [facility]
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
## 2017-02-25 NOTE — ED Triage Notes (Signed)
PT presents to ED with complaints of left arm pain after getting hit playing soccer today left arm swollen bruised and hard

## 2017-02-25 NOTE — ED Notes (Signed)
MD at BS

## 2017-02-26 ENCOUNTER — Emergency Department (HOSPITAL_COMMUNITY)
Admission: EM | Admit: 2017-02-26 | Discharge: 2017-02-26 | Disposition: A | Discharge status: Home / Self Care | Payer: BLUE CROSS/BLUE SHIELD | Attending: Emergency Medicine | Admitting: Emergency Medicine

## 2017-02-26 ENCOUNTER — Ambulatory Visit: Payer: BLUE CROSS/BLUE SHIELD | Admitting: Family Medicine

## 2017-02-26 ENCOUNTER — Encounter (HOSPITAL_COMMUNITY): Payer: Self-pay | Admitting: *Deleted

## 2017-02-26 ENCOUNTER — Emergency Department (HOSPITAL_COMMUNITY): Payer: BLUE CROSS/BLUE SHIELD

## 2017-02-26 DIAGNOSIS — M7989 Other specified soft tissue disorders: Secondary | ICD-10-CM | POA: Diagnosis not present

## 2017-02-26 DIAGNOSIS — M25512 Pain in left shoulder: Secondary | ICD-10-CM | POA: Diagnosis not present

## 2017-02-26 DIAGNOSIS — W2102XS Struck by soccer ball, sequela: Secondary | ICD-10-CM | POA: Insufficient documentation

## 2017-02-26 DIAGNOSIS — S4992XS Unspecified injury of left shoulder and upper arm, sequela: Secondary | ICD-10-CM | POA: Diagnosis not present

## 2017-02-26 DIAGNOSIS — Z87891 Personal history of nicotine dependence: Secondary | ICD-10-CM | POA: Insufficient documentation

## 2017-02-26 DIAGNOSIS — Z7982 Long term (current) use of aspirin: Secondary | ICD-10-CM | POA: Insufficient documentation

## 2017-02-26 DIAGNOSIS — Z79899 Other long term (current) drug therapy: Secondary | ICD-10-CM | POA: Insufficient documentation

## 2017-02-26 LAB — I-STAT CHEM 8, ED
BUN: 21 mg/dL — AB (ref 6–20)
CALCIUM ION: 1.15 mmol/L (ref 1.15–1.40)
Chloride: 106 mmol/L (ref 101–111)
Creatinine, Ser: 0.9 mg/dL (ref 0.61–1.24)
GLUCOSE: 96 mg/dL (ref 65–99)
HCT: 38 % — ABNORMAL LOW (ref 39.0–52.0)
Hemoglobin: 12.9 g/dL — ABNORMAL LOW (ref 13.0–17.0)
POTASSIUM: 4.3 mmol/L (ref 3.5–5.1)
Sodium: 139 mmol/L (ref 135–145)
TCO2: 26 mmol/L (ref 0–100)

## 2017-02-26 MED ORDER — HYDROCODONE-ACETAMINOPHEN 5-325 MG PO TABS: 1.0000 | ORAL_TABLET | Freq: Four times a day (QID) | ORAL | 0 refills | Status: DC | PRN

## 2017-02-26 MED ORDER — HYDROCODONE-ACETAMINOPHEN 5-325 MG PO TABS: 1.0000 | ORAL_TABLET | ORAL | 0 refills | Status: DC | PRN

## 2017-02-26 MED ORDER — HYDROCODONE-ACETAMINOPHEN 5-325 MG PO TABS
1.0000 | ORAL_TABLET | Freq: Once | ORAL | Status: AC
Start: 2017-02-26 — End: 2017-02-26
  Administered 2017-02-26: 1 via ORAL
  Filled 2017-02-26: qty 1

## 2017-02-26 MED ORDER — IOPAMIDOL (ISOVUE-370) INJECTION 76%
INTRAVENOUS | Status: AC
  Administered 2017-02-26: 100 mL
  Filled 2017-02-26: qty 100

## 2017-02-26 NOTE — ED Notes (Signed)
Patient transported to CT via stretcher.

## 2017-02-26 NOTE — ED Notes (Signed)
Resident assessing patient now

## 2017-02-26 NOTE — Discharge Instructions (Signed)
You were seen today for an injury to the left arm. You have extensive swelling and bruising that may be related to a partial ligament or tendon tear at the elbow. It is difficult to fully assess because of the swelling. Your x-rays are negative for fracture. Follow-up with sports medicine for repeat evaluation. Continue rest, ice, elevation. You will be provided a sling for comfort. Continue anti-inflammatory medications for pain. You should not participate in sports until cleared by sports medicine.

## 2017-02-26 NOTE — ED Notes (Signed)
MD at bedside. 

## 2017-02-26 NOTE — ED Notes (Signed)
Pt discharged to home with family. NAD.  
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
## 2017-02-26 NOTE — ED Triage Notes (Addendum)
Pt is here with left arm injury from soccer ball and has significant for bruising and swelling.  Pt has palpable left radial pulse.  Concerned for compartment syndrome. Pt has tingling to left arm and pain has been increasing.

## 2017-02-27 ENCOUNTER — Ambulatory Visit (HOSPITAL_COMMUNITY)
Admission: RE | Admit: 2017-02-27 | Discharge: 2017-02-27 | Disposition: A | Discharge status: Home / Self Care | Payer: BLUE CROSS/BLUE SHIELD | Source: Ambulatory Visit | Attending: Emergency Medicine | Admitting: Emergency Medicine

## 2017-02-27 DIAGNOSIS — M7989 Other specified soft tissue disorders: Secondary | ICD-10-CM

## 2017-02-27 DIAGNOSIS — M79662 Pain in left lower leg: Secondary | ICD-10-CM | POA: Insufficient documentation

## 2017-02-27 NOTE — Progress Notes (Signed)
*  PRELIMINARY RESULTS* Vascular Ultrasound Left upper extremity venous duplex has been completed.  Preliminary findings: No evidence of deep or superficial vein thrombosis involving the visualized veins of the left upper extremity. Some slow flow noted in the proximal IVJ, no obvious thrombosis.  Intramuscular heterogeneous area seen mid upper arm, area on trauma.  Myrtie Cruise Arayah Krouse 02/27/2017, 10:20 AM

## 2017-03-06 ENCOUNTER — Other Ambulatory Visit: Payer: Self-pay | Admitting: Family Medicine

## 2017-03-06 DIAGNOSIS — M50222 Other cervical disc displacement at C5-C6 level: Secondary | ICD-10-CM | POA: Diagnosis not present

## 2017-03-15 DIAGNOSIS — M792 Neuralgia and neuritis, unspecified: Secondary | ICD-10-CM | POA: Diagnosis not present

## 2017-03-15 DIAGNOSIS — M5030 Other cervical disc degeneration, unspecified cervical region: Secondary | ICD-10-CM | POA: Diagnosis not present

## 2017-03-16 DIAGNOSIS — M25522 Pain in left elbow: Secondary | ICD-10-CM | POA: Diagnosis not present

## 2017-03-16 DIAGNOSIS — M25422 Effusion, left elbow: Secondary | ICD-10-CM | POA: Diagnosis not present

## 2017-03-27 ENCOUNTER — Other Ambulatory Visit: Payer: Self-pay | Admitting: Orthopedic Surgery

## 2017-03-27 DIAGNOSIS — S46292D Other injury of muscle, fascia and tendon of other parts of biceps, left arm, subsequent encounter: Secondary | ICD-10-CM | POA: Diagnosis not present

## 2017-03-27 DIAGNOSIS — S46292A Other injury of muscle, fascia and tendon of other parts of biceps, left arm, initial encounter: Secondary | ICD-10-CM | POA: Diagnosis not present

## 2017-03-27 DIAGNOSIS — S4442XA Injury of musculocutaneous nerve, left arm, initial encounter: Secondary | ICD-10-CM | POA: Diagnosis not present

## 2017-03-27 DIAGNOSIS — R2232 Localized swelling, mass and lump, left upper limb: Secondary | ICD-10-CM | POA: Diagnosis not present

## 2017-03-27 DIAGNOSIS — D171 Benign lipomatous neoplasm of skin and subcutaneous tissue of trunk: Secondary | ICD-10-CM | POA: Diagnosis not present

## 2017-03-27 DIAGNOSIS — Y999 Unspecified external cause status: Secondary | ICD-10-CM | POA: Diagnosis not present

## 2017-03-27 DIAGNOSIS — S46212A Strain of muscle, fascia and tendon of other parts of biceps, left arm, initial encounter: Secondary | ICD-10-CM | POA: Diagnosis not present

## 2017-03-27 DIAGNOSIS — D2112 Benign neoplasm of connective and other soft tissue of left upper limb, including shoulder: Secondary | ICD-10-CM | POA: Diagnosis not present

## 2017-04-06 DIAGNOSIS — S46212D Strain of muscle, fascia and tendon of other parts of biceps, left arm, subsequent encounter: Secondary | ICD-10-CM | POA: Diagnosis not present

## 2017-04-09 ENCOUNTER — Ambulatory Visit: Payer: BLUE CROSS/BLUE SHIELD | Admitting: Family Medicine
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
## 2017-04-16 ENCOUNTER — Encounter: Payer: Self-pay | Admitting: Family Medicine

## 2017-04-16 ENCOUNTER — Ambulatory Visit (INDEPENDENT_AMBULATORY_CARE_PROVIDER_SITE_OTHER): Payer: BLUE CROSS/BLUE SHIELD | Admitting: Family Medicine

## 2017-04-16 VITALS — BP 102/78 | HR 96 | Temp 98.1°F | Ht 67.0 in | Wt 197.7 lb

## 2017-04-16 DIAGNOSIS — Z6830 Body mass index (BMI) 30.0-30.9, adult: Secondary | ICD-10-CM

## 2017-04-16 DIAGNOSIS — M5412 Radiculopathy, cervical region: Secondary | ICD-10-CM | POA: Insufficient documentation

## 2017-04-16 DIAGNOSIS — F411 Generalized anxiety disorder: Secondary | ICD-10-CM

## 2017-04-16 DIAGNOSIS — E7800 Pure hypercholesterolemia, unspecified: Secondary | ICD-10-CM | POA: Diagnosis not present

## 2017-04-16 DIAGNOSIS — R739 Hyperglycemia, unspecified: Secondary | ICD-10-CM

## 2017-04-16 NOTE — Patient Instructions (Signed)
BEFORE YOU LEAVE: -follow up: physical with labs in October - come fasting  Advise regular aerobic exercise (at least 150 minutes per week of sweaty exercise) and a healthy diet. Try to eat at least 5-9 servings of vegetables and fruits per day (not corn, potatoes or bananas.) Avoid sweets, red meat, pork, butter, fried foods, fast food, processed food, excessive dairy, eggs and coconut. Replace bad fats with good fats - fish, nuts and seeds, canola oil, olive oil.

## 2017-04-17 DIAGNOSIS — S46212D Strain of muscle, fascia and tendon of other parts of biceps, left arm, subsequent encounter: Secondary | ICD-10-CM | POA: Diagnosis not present

## 2017-04-23 DIAGNOSIS — S46212D Strain of muscle, fascia and tendon of other parts of biceps, left arm, subsequent encounter: Secondary | ICD-10-CM | POA: Diagnosis not present

## 2017-05-04 DIAGNOSIS — M5030 Other cervical disc degeneration, unspecified cervical region: Secondary | ICD-10-CM | POA: Diagnosis not present

## 2017-05-04 DIAGNOSIS — R200 Anesthesia of skin: Secondary | ICD-10-CM | POA: Diagnosis not present

## 2017-05-05 DIAGNOSIS — M5030 Other cervical disc degeneration, unspecified cervical region: Secondary | ICD-10-CM | POA: Diagnosis not present

## 2017-05-05 DIAGNOSIS — R200 Anesthesia of skin: Secondary | ICD-10-CM | POA: Diagnosis not present

## 2017-05-09 DIAGNOSIS — S46212D Strain of muscle, fascia and tendon of other parts of biceps, left arm, subsequent encounter: Secondary | ICD-10-CM | POA: Diagnosis not present

## 2017-05-23 DIAGNOSIS — M5030 Other cervical disc degeneration, unspecified cervical region: Secondary | ICD-10-CM | POA: Diagnosis not present

## 2017-05-23 DIAGNOSIS — R200 Anesthesia of skin: Secondary | ICD-10-CM | POA: Diagnosis not present

## 2017-05-23 DIAGNOSIS — G5601 Carpal tunnel syndrome, right upper limb: Secondary | ICD-10-CM | POA: Diagnosis not present

## 2017-05-25 DIAGNOSIS — S46212D Strain of muscle, fascia and tendon of other parts of biceps, left arm, subsequent encounter: Secondary | ICD-10-CM | POA: Diagnosis not present

## 2017-05-30 DIAGNOSIS — M25422 Effusion, left elbow: Secondary | ICD-10-CM | POA: Diagnosis not present

## 2017-05-30 DIAGNOSIS — M25522 Pain in left elbow: Secondary | ICD-10-CM | POA: Diagnosis not present

## 2017-06-06 DIAGNOSIS — M25422 Effusion, left elbow: Secondary | ICD-10-CM | POA: Diagnosis not present

## 2017-06-06 DIAGNOSIS — M25522 Pain in left elbow: Secondary | ICD-10-CM | POA: Diagnosis not present

## 2017-06-12 DIAGNOSIS — M50322 Other cervical disc degeneration at C5-C6 level: Secondary | ICD-10-CM | POA: Diagnosis not present

## 2017-06-12 DIAGNOSIS — M50323 Other cervical disc degeneration at C6-C7 level: Secondary | ICD-10-CM | POA: Diagnosis not present

## 2017-06-30 DIAGNOSIS — M5030 Other cervical disc degeneration, unspecified cervical region: Secondary | ICD-10-CM | POA: Diagnosis not present

## 2017-06-30 DIAGNOSIS — M5126 Other intervertebral disc displacement, lumbar region: Secondary | ICD-10-CM | POA: Diagnosis not present

## 2017-06-30 DIAGNOSIS — M5416 Radiculopathy, lumbar region: Secondary | ICD-10-CM | POA: Diagnosis not present

## 2017-07-04 DIAGNOSIS — M792 Neuralgia and neuritis, unspecified: Secondary | ICD-10-CM | POA: Diagnosis not present

## 2017-07-04 DIAGNOSIS — G5601 Carpal tunnel syndrome, right upper limb: Secondary | ICD-10-CM | POA: Diagnosis not present

## 2017-07-06 DIAGNOSIS — M5416 Radiculopathy, lumbar region: Secondary | ICD-10-CM | POA: Diagnosis not present

## 2017-07-12 ENCOUNTER — Encounter: Payer: Self-pay | Admitting: Family Medicine

## 2017-07-13 DIAGNOSIS — M5030 Other cervical disc degeneration, unspecified cervical region: Secondary | ICD-10-CM | POA: Diagnosis not present

## 2017-07-13 DIAGNOSIS — M5126 Other intervertebral disc displacement, lumbar region: Secondary | ICD-10-CM | POA: Diagnosis not present

## 2017-07-16 ENCOUNTER — Other Ambulatory Visit: Payer: Self-pay | Admitting: Physical Medicine and Rehabilitation

## 2017-07-16 DIAGNOSIS — M5126 Other intervertebral disc displacement, lumbar region: Secondary | ICD-10-CM

## 2017-07-19 ENCOUNTER — Other Ambulatory Visit: Payer: Self-pay | Admitting: Physical Medicine and Rehabilitation

## 2017-07-19 DIAGNOSIS — M5030 Other cervical disc degeneration, unspecified cervical region: Secondary | ICD-10-CM

## 2017-07-27 ENCOUNTER — Other Ambulatory Visit: Payer: Self-pay | Admitting: Family Medicine

## 2017-07-27 ENCOUNTER — Ambulatory Visit
Admission: RE | Admit: 2017-07-27 | Discharge: 2017-07-27 | Disposition: A | Discharge status: Home / Self Care | Payer: BLUE CROSS/BLUE SHIELD | Source: Ambulatory Visit | Attending: Physical Medicine and Rehabilitation | Admitting: Physical Medicine and Rehabilitation

## 2017-07-27 DIAGNOSIS — M5126 Other intervertebral disc displacement, lumbar region: Secondary | ICD-10-CM | POA: Diagnosis not present

## 2017-07-27 MED ORDER — METHYLPREDNISOLONE ACETATE 40 MG/ML INJ SUSP (RADIOLOG
120.0000 mg | Freq: Once | INTRAMUSCULAR | Status: AC
  Administered 2017-07-27: 120 mg via EPIDURAL

## 2017-07-27 MED ORDER — IOPAMIDOL (ISOVUE-M 200) INJECTION 41%
1.0000 mL | Freq: Once | INTRAMUSCULAR | Status: AC
  Administered 2017-07-27: 1 mL via EPIDURAL

## 2017-07-27 NOTE — Discharge Instructions (Signed)

## 2017-07-29 ENCOUNTER — Ambulatory Visit
Admission: RE | Admit: 2017-07-29 | Discharge: 2017-07-29 | Disposition: A | Discharge status: Home / Self Care | Payer: BLUE CROSS/BLUE SHIELD | Source: Ambulatory Visit | Attending: Physical Medicine and Rehabilitation | Admitting: Physical Medicine and Rehabilitation

## 2017-07-29 DIAGNOSIS — M4802 Spinal stenosis, cervical region: Secondary | ICD-10-CM | POA: Diagnosis not present

## 2017-07-29 DIAGNOSIS — M5030 Other cervical disc degeneration, unspecified cervical region: Secondary | ICD-10-CM

## 2017-08-15 DIAGNOSIS — M5030 Other cervical disc degeneration, unspecified cervical region: Secondary | ICD-10-CM | POA: Diagnosis not present

## 2017-08-15 DIAGNOSIS — M5126 Other intervertebral disc displacement, lumbar region: Secondary | ICD-10-CM | POA: Diagnosis not present

## 2017-08-15 DIAGNOSIS — M79645 Pain in left finger(s): Secondary | ICD-10-CM | POA: Diagnosis not present
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
## 2017-08-21 ENCOUNTER — Encounter: Payer: Self-pay | Admitting: Family Medicine

## 2017-08-21 ENCOUNTER — Ambulatory Visit (INDEPENDENT_AMBULATORY_CARE_PROVIDER_SITE_OTHER): Payer: BLUE CROSS/BLUE SHIELD | Admitting: Family Medicine

## 2017-08-21 VITALS — BP 120/80 | HR 99 | Temp 97.5°F | Ht 65.75 in | Wt 199.9 lb

## 2017-08-21 DIAGNOSIS — Z1331 Encounter for screening for depression: Secondary | ICD-10-CM

## 2017-08-21 DIAGNOSIS — E7800 Pure hypercholesterolemia, unspecified: Secondary | ICD-10-CM

## 2017-08-21 DIAGNOSIS — Z6832 Body mass index (BMI) 32.0-32.9, adult: Secondary | ICD-10-CM | POA: Diagnosis not present

## 2017-08-21 DIAGNOSIS — R739 Hyperglycemia, unspecified: Secondary | ICD-10-CM | POA: Diagnosis not present

## 2017-08-21 DIAGNOSIS — R042 Hemoptysis: Secondary | ICD-10-CM

## 2017-08-21 DIAGNOSIS — Z0000 Encounter for general adult medical examination without abnormal findings: Secondary | ICD-10-CM | POA: Diagnosis not present

## 2017-08-21 DIAGNOSIS — R05 Cough: Secondary | ICD-10-CM

## 2017-08-21 DIAGNOSIS — Z125 Encounter for screening for malignant neoplasm of prostate: Secondary | ICD-10-CM

## 2017-08-21 DIAGNOSIS — F411 Generalized anxiety disorder: Secondary | ICD-10-CM

## 2017-08-21 DIAGNOSIS — R059 Cough, unspecified: Secondary | ICD-10-CM

## 2017-08-21 LAB — HEMOGLOBIN A1C: HEMOGLOBIN A1C: 6.3 % (ref 4.6–6.5)

## 2017-08-21 LAB — PSA: PSA: 1.9 ng/mL (ref 0.10–4.00)

## 2017-08-21 LAB — LIPID PANEL
CHOLESTEROL: 216 mg/dL — AB (ref 0–200)
HDL: 56.4 mg/dL (ref >39.00)
LDL Cholesterol: 121 mg/dL — ABNORMAL HIGH (ref 0–99)
NONHDL: 160.03
TRIGLYCERIDES: 197 mg/dL — AB (ref 0.0–149.0)
Total CHOL/HDL Ratio: 4
VLDL: 39.4 mg/dL (ref 0.0–40.0)

## 2017-08-21 NOTE — Patient Instructions (Signed)
BEFORE YOU LEAVE: -xray sheet -labs -follow up: 4-6 months  Go get the xray  Will be doing an antibiotic, cough medication and likely a steroid as well after we get the xray results.  Follow up or seek care promptly if worsening, new concerns or not improving.  Regular gentle exercise once better - work up to 3 miles walking 4-5 days per week. Try to eat at least 5-9 servings of vegetables and fruits per day (not corn, potatoes or bananas.) Avoid sweets, red meat, pork, butter, fried foods, fast food, processed food, excessive dairy, eggs and coconut. Replace bad fats with good fats - fish, nuts and seeds, canola oil, olive oil.    We have ordered labs or studies at this visit. It can take up to 1-2 weeks for results and processing. IF results require follow up or explanation, we will call you with instructions. Clinically stable results will be released to your [facility]. If you have not heard from [place] or cannot find your results in [facility] in 2 weeks please contact our office at [phone].  If you are not yet signed up for [facility], please consider signing up.    Health Maintenance, Male A healthy lifestyle and preventive care is important for your health and wellness. Ask your health care provider about what schedule of regular examinations is right for you. What should I know about weight and diet? Eat a Healthy Diet  Eat plenty of vegetables, fruits, whole grains, low-fat dairy products, and lean protein.  Do not eat a lot of foods high in solid fats, added sugars, or salt.  Maintain a Healthy Weight Regular exercise can help you achieve or maintain a healthy weight. You should:  Do at least 150 minutes of exercise each week. The exercise should increase your heart rate and make you sweat (moderate-intensity exercise).  Do strength-training exercises at least twice a week.  Watch Your Levels of Cholesterol and Blood Lipids  Have your blood tested for lipids and cholesterol  every 5 years starting at 54 years of age. If you are at high risk for heart disease, you should start having your blood tested when you are 54 years old. You may need to have your cholesterol levels checked more often if: ? Your lipid or cholesterol levels are high. ? You are older than 54 years of age. ? You are at high risk for heart disease.  What should I know about cancer screening? Many types of cancers can be detected early and may often be prevented. Lung Cancer  You should be screened every year for lung cancer if: ? You are a current smoker who has smoked for at least 30 years. ? You are a former smoker who has quit within the past 15 years.  Talk to your health care provider about your screening options, when you should start screening, and how often you should be screened.  Colorectal Cancer  Routine colorectal cancer screening usually begins at 54 years of age and should be repeated every 5-10 years until you are 54 years old. You may need to be screened more often if early forms of precancerous polyps or small growths are found. Your health care provider may recommend screening at an earlier age if you have risk factors for colon cancer.  Your health care provider may recommend using home test kits to check for hidden blood in the stool.  A small camera at the end of a tube can be used to examine your colon (sigmoidoscopy  or colonoscopy). This checks for the earliest forms of colorectal cancer.  Prostate and Testicular Cancer  Depending on your age and overall health, your health care provider may do certain tests to screen for prostate and testicular cancer.  Talk to your health care provider about any symptoms or concerns you have about testicular or prostate cancer.  Skin Cancer  Check your skin from head to toe regularly.  Tell your health care provider about any new moles or changes in moles, especially if: ? There is a change in a mole's size, shape, or  color. ? You have a mole that is larger than a pencil eraser.  Always use sunscreen. Apply sunscreen liberally and repeat throughout the day.  Protect yourself by wearing long sleeves, pants, a wide-brimmed hat, and sunglasses when outside.  What should I know about heart disease, diabetes, and high blood pressure?  If you are 29-38 years of age, have your blood pressure checked every 3-5 years. If you are 52 years of age or older, have your blood pressure checked every year. You should have your blood pressure measured twice-once when you are at a hospital or clinic, and once when you are not at a hospital or clinic. Record the average of the two measurements. To check your blood pressure when you are not at a hospital or clinic, you can use: ? An automated blood pressure machine at a pharmacy. ? A home blood pressure monitor.  Talk to your health care provider about your target blood pressure.  If you are between 59-22 years old, ask your health care provider if you should take aspirin to prevent heart disease.  Have regular diabetes screenings by checking your fasting blood sugar level. ? If you are at a normal weight and have a low risk for diabetes, have this test once every three years after the age of 86. ? If you are overweight and have a high risk for diabetes, consider being tested at a younger age or more often.  A one-time screening for abdominal aortic aneurysm (AAA) by ultrasound is recommended for men aged 75-75 years who are current or former smokers. What should I know about preventing infection? Hepatitis B If you have a higher risk for hepatitis B, you should be screened for this virus. Talk with your health care provider to find out if you are at risk for hepatitis B infection. Hepatitis C Blood testing is recommended for:  Everyone born from [birth year] through 1963.  Anyone with known risk factors for hepatitis C.  Sexually Transmitted Diseases (STDs)  You should be  screened each year for STDs including gonorrhea and chlamydia if: ? You are sexually active and are younger than 54 years of age. ? You are older than 54 years of age and your health care provider tells you that you are at risk for this type of infection. ? Your sexual activity has changed since you were last screened and you are at an increased risk for chlamydia or gonorrhea. Ask your health care provider if you are at risk.  Talk with your health care provider about whether you are at high risk of being infected with HIV. Your health care provider may recommend a prescription medicine to help prevent HIV infection.  What else can I do?  Schedule regular health, dental, and eye exams.  Stay current with your vaccines (immunizations).  Do not use any tobacco products, such as cigarettes, chewing tobacco, and e-cigarettes. If you need help quitting, ask your  health care provider.  Limit alcohol intake to no more than 2 drinks per day. One drink equals 12 ounces of beer, 5 ounces of wine, or 1 ounces of hard liquor.  Do not use street drugs.  Do not share needles.  Ask your health care provider for help if you need support or information about quitting drugs.  Tell your health care provider if you often feel depressed.  Tell your health care provider if you have ever been abused or do not feel safe at home. This information is not intended to replace advice given to you by your health care provider. Make sure you discuss any questions you have with your health care provider. Document Released: 04/06/2008 Document Revised: 06/07/2016 Document Reviewed: 07/13/2015 Elsevier Interactive Patient Education  [employer].

## 2017-08-22 ENCOUNTER — Ambulatory Visit (INDEPENDENT_AMBULATORY_CARE_PROVIDER_SITE_OTHER)
Admission: RE | Admit: 2017-08-22 | Discharge: 2017-08-22 | Disposition: A | Discharge status: Home / Self Care | Payer: BLUE CROSS/BLUE SHIELD | Source: Ambulatory Visit | Attending: Family Medicine | Admitting: Family Medicine

## 2017-08-22 DIAGNOSIS — R05 Cough: Secondary | ICD-10-CM | POA: Diagnosis not present

## 2017-08-22 DIAGNOSIS — R918 Other nonspecific abnormal finding of lung field: Secondary | ICD-10-CM | POA: Diagnosis not present

## 2017-08-22 DIAGNOSIS — R042 Hemoptysis: Secondary | ICD-10-CM | POA: Diagnosis not present

## 2017-08-22 DIAGNOSIS — R059 Cough, unspecified: Secondary | ICD-10-CM

## 2017-08-23 ENCOUNTER — Other Ambulatory Visit: Payer: Self-pay | Admitting: *Deleted

## 2017-08-23 MED ORDER — BENZONATATE 100 MG PO CAPS: 100.0000 mg | ORAL_CAPSULE | Freq: Two times a day (BID) | ORAL | 0 refills | Status: DC | PRN

## 2017-08-23 MED ORDER — DOXYCYCLINE HYCLATE 100 MG PO TABS: 100.0000 mg | ORAL_TABLET | Freq: Two times a day (BID) | ORAL | 0 refills | Status: DC

## 2017-08-23 MED ORDER — PREDNISONE 20 MG PO TABS: ORAL_TABLET | ORAL | 0 refills | Status: DC

## 2017-08-23 NOTE — Telephone Encounter (Signed)
Rx done. 

## 2017-09-12 DIAGNOSIS — G5601 Carpal tunnel syndrome, right upper limb: Secondary | ICD-10-CM | POA: Diagnosis not present

## 2017-09-12 DIAGNOSIS — M79645 Pain in left finger(s): Secondary | ICD-10-CM | POA: Diagnosis not present

## 2017-09-18 ENCOUNTER — Telehealth: Payer: Self-pay | Admitting: Family Medicine

## 2017-09-18 DIAGNOSIS — R739 Hyperglycemia, unspecified: Secondary | ICD-10-CM

## 2017-09-18 DIAGNOSIS — F411 Generalized anxiety disorder: Secondary | ICD-10-CM

## 2017-09-18 DIAGNOSIS — E7800 Pure hypercholesterolemia, unspecified: Secondary | ICD-10-CM

## 2017-09-18 NOTE — Telephone Encounter (Signed)
Form placed on Dr Julianne Rice desk.

## 2017-09-18 NOTE — Telephone Encounter (Signed)
Patient dropped off Attending Physicians Report to be filled out and kept in the clear sleeve that it was dropped off in.  Call the patient for pickup at [phone]  Disposition: Dr's Folder

## 2017-09-20 ENCOUNTER — Encounter: Payer: Self-pay | Admitting: Family Medicine

## 2017-09-20 DIAGNOSIS — E669 Obesity, unspecified: Secondary | ICD-10-CM | POA: Insufficient documentation

## 2017-09-20 NOTE — Telephone Encounter (Signed)
Per Dr Maudie Mercury the form is very detailed and he could be asked a number of questions over the phone; however it would be best that he come in for an appt and bring any notes from any specialist he has seen to this visit as well.  I called the pt, informed him of this and scheduled an appt for 12/17.

## 2017-09-21 NOTE — Telephone Encounter (Signed)
Okay form was given to [place].  Patient is scheduled to come in.

## 2017-10-08 ENCOUNTER — Ambulatory Visit (INDEPENDENT_AMBULATORY_CARE_PROVIDER_SITE_OTHER): Payer: BLUE CROSS/BLUE SHIELD | Admitting: Family Medicine

## 2017-10-08 ENCOUNTER — Encounter: Payer: Self-pay | Admitting: Family Medicine

## 2017-10-08 VITALS — BP 100/70 | HR 82 | Temp 97.9°F | Ht 65.75 in | Wt 202.7 lb

## 2017-10-08 DIAGNOSIS — R739 Hyperglycemia, unspecified: Secondary | ICD-10-CM

## 2017-10-08 DIAGNOSIS — E7800 Pure hypercholesterolemia, unspecified: Secondary | ICD-10-CM | POA: Diagnosis not present

## 2017-10-08 DIAGNOSIS — F411 Generalized anxiety disorder: Secondary | ICD-10-CM

## 2017-10-08 DIAGNOSIS — E669 Obesity, unspecified: Secondary | ICD-10-CM | POA: Diagnosis not present
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
## 2017-10-08 NOTE — Patient Instructions (Addendum)
BEFORE YOU LEAVE: -Forms, copy, then placed one copy in the scanned box and send one copy for him -Print out the last 3 office visits for him, his most recent labs in his up-to-date medication list -attached this to the forms to send -See if he wants to get his flu shot today -follow up: 4 months

## 2017-11-01 ENCOUNTER — Encounter: Payer: Self-pay | Admitting: Family Medicine

## 2017-11-05 ENCOUNTER — Other Ambulatory Visit: Payer: Self-pay

## 2017-11-05 ENCOUNTER — Emergency Department (HOSPITAL_COMMUNITY)
Admission: EM | Admit: 2017-11-05 | Discharge: 2017-11-05 | Disposition: A | Discharge status: Home / Self Care | Payer: BLUE CROSS/BLUE SHIELD | Attending: Emergency Medicine | Admitting: Emergency Medicine

## 2017-11-05 ENCOUNTER — Encounter (HOSPITAL_COMMUNITY): Payer: Self-pay

## 2017-11-05 ENCOUNTER — Emergency Department (HOSPITAL_COMMUNITY): Payer: BLUE CROSS/BLUE SHIELD

## 2017-11-05 DIAGNOSIS — J069 Acute upper respiratory infection, unspecified: Secondary | ICD-10-CM | POA: Diagnosis not present

## 2017-11-05 DIAGNOSIS — B9789 Other viral agents as the cause of diseases classified elsewhere: Secondary | ICD-10-CM

## 2017-11-05 DIAGNOSIS — R05 Cough: Secondary | ICD-10-CM | POA: Diagnosis not present

## 2017-11-05 DIAGNOSIS — Z87891 Personal history of nicotine dependence: Secondary | ICD-10-CM | POA: Insufficient documentation

## 2017-11-05 DIAGNOSIS — R0789 Other chest pain: Secondary | ICD-10-CM | POA: Insufficient documentation

## 2017-11-05 DIAGNOSIS — Z7982 Long term (current) use of aspirin: Secondary | ICD-10-CM | POA: Diagnosis not present

## 2017-11-05 DIAGNOSIS — R0602 Shortness of breath: Secondary | ICD-10-CM | POA: Diagnosis not present

## 2017-11-05 MED ORDER — BENZONATATE 100 MG PO CAPS
200.0000 mg | ORAL_CAPSULE | Freq: Once | ORAL | Status: AC
  Administered 2017-11-05: 200 mg via ORAL
  Filled 2017-11-05: qty 2

## 2017-11-05 MED ORDER — KETOROLAC TROMETHAMINE 10 MG PO TABS: 10.0000 mg | ORAL_TABLET | Freq: Four times a day (QID) | ORAL | 0 refills | Status: DC | PRN

## 2017-11-05 MED ORDER — BENZONATATE 100 MG PO CAPS: 100.0000 mg | ORAL_CAPSULE | Freq: Three times a day (TID) | ORAL | 0 refills | Status: DC

## 2017-11-05 MED ORDER — KETOROLAC TROMETHAMINE 30 MG/ML IJ SOLN: 30.0000 mg | Freq: Once | INTRAMUSCULAR | Status: DC

## 2017-11-05 MED ORDER — KETOROLAC TROMETHAMINE 30 MG/ML IJ SOLN
30.0000 mg | Freq: Once | INTRAMUSCULAR | Status: AC
  Administered 2017-11-05: 30 mg via INTRAMUSCULAR
  Filled 2017-11-05: qty 1
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
## 2017-11-05 NOTE — ED Triage Notes (Signed)
Per Pt, Pt had an infection that started last August when he went to [place] for a business trip. Pt reports seeing MD and has been treated with antibiotics, but the coughing has continued. Pt had trip on December to [place] and he became more sick and then went to [place] for vacation. While in [place], pt reports that he had an episode where he coughed and sneezed noting a "popping" in his right rib cage. Noted to have blood in nasal congestion and intermittent blood in chest sputum.

## 2017-11-05 NOTE — ED Provider Notes (Signed)
Patient placed in Quick Look pathway, seen and evaluated   Chief Complaint: Rib pain  HPI:   57 year old male presents today with complaints of left rib pain.  Patient reports that last week he was experiencing upper respiratory symptoms with cough.  He notes the cough was so severe that it caused an acute episode of left rib pain and popping after a coughing fit.  Patient notes this is located on the left lower ribs, this was approximately 1 week ago.  Patient was in [place] at the time went to the emergency room where he had a chest x-ray, was given pain medication and breathing treatment.  He notes the chest x-ray showed no significant findings.  Patient notes since this time he has continued to have left rib pain, denies any significant shortness of breath, denies any fever.  Patient notes he continues to have upper respiratory symptoms.  ROS: Positive for cough, positive for rib pain, negative for fever negative for abdominal pain (one)  Physical Exam:   Gen: No distress  Neuro: Awake and Alert  Skin: Warm    Focused Exam: Left ribs atraumatic, no crepitus, exquisitely tenderness palpation of the left lateral lower ribs, lung expansion normal, lung sounds clear, abdomen soft nontender  Vitals:   11/05/17 1455  BP: (!) 155/114  Pulse: 97  Resp: (!) 22  Temp: 98.9 F (37.2 C)  SpO2: 98%     Initiation of care has begun. The patient has been counseled on the process, plan, and necessity for staying for the completion/evaluation, and the remainder of the medical screening examination    Okey Regal, Hershal Coria 11/05/17 [place], Julie, MD 11/05/17 [phone]

## 2017-11-05 NOTE — ED Notes (Signed)
ED Provider at bedside. 

## 2017-11-07 ENCOUNTER — Telehealth: Payer: Self-pay | Admitting: *Deleted

## 2017-11-07 NOTE — Telephone Encounter (Signed)
Pharmacy called to verify toradol injection was given prior to filling toradol Rx.

## 2017-11-30 DIAGNOSIS — G5601 Carpal tunnel syndrome, right upper limb: Secondary | ICD-10-CM | POA: Diagnosis not present

## 2017-12-05 ENCOUNTER — Telehealth: Payer: Self-pay | Admitting: Family Medicine

## 2017-12-05 NOTE — Telephone Encounter (Signed)
Copied from [place]. Topic: General - Other >> Dec 05, 2017  2:16 PM Conception Chancy, NT wrote: Patient would like to switch care from Dr. Maudie Mercury to Dr. Jonni Sanger at [place] since it is closer to where he lives. Contact patient if this transition is okay.

## 2017-12-05 NOTE — Telephone Encounter (Signed)
Absolutely. Will be happy to see him. Please schedule an appointment.

## 2017-12-05 NOTE — Telephone Encounter (Signed)
[place] with me. I will miss him, but certainly understand! Please route to provider he is requesting.

## 2017-12-10 NOTE — Telephone Encounter (Signed)
LM for pt to CB to schedule a 30 office visit with Dr. Jonni Sanger, pt is transferring care from Dr. Maudie Mercury to Dr. Jonni Sanger.

## 2017-12-11 ENCOUNTER — Ambulatory Visit (INDEPENDENT_AMBULATORY_CARE_PROVIDER_SITE_OTHER): Payer: BLUE CROSS/BLUE SHIELD | Admitting: Family Medicine

## 2017-12-11 ENCOUNTER — Encounter: Payer: Self-pay | Admitting: Family Medicine

## 2017-12-11 VITALS — BP 122/84 | HR 87 | Temp 98.3°F | Ht 66.5 in | Wt 198.0 lb

## 2017-12-11 DIAGNOSIS — K5790 Diverticulosis of intestine, part unspecified, without perforation or abscess without bleeding: Secondary | ICD-10-CM

## 2017-12-11 DIAGNOSIS — F411 Generalized anxiety disorder: Secondary | ICD-10-CM

## 2017-12-11 DIAGNOSIS — K5730 Diverticulosis of large intestine without perforation or abscess without bleeding: Secondary | ICD-10-CM | POA: Diagnosis not present

## 2017-12-11 DIAGNOSIS — R7303 Prediabetes: Secondary | ICD-10-CM | POA: Diagnosis not present

## 2017-12-11 DIAGNOSIS — R1310 Dysphagia, unspecified: Secondary | ICD-10-CM | POA: Diagnosis not present

## 2017-12-11 DIAGNOSIS — R05 Cough: Secondary | ICD-10-CM | POA: Diagnosis not present

## 2017-12-11 DIAGNOSIS — R053 Chronic cough: Secondary | ICD-10-CM

## 2017-12-11 DIAGNOSIS — F5102 Adjustment insomnia: Secondary | ICD-10-CM

## 2017-12-11 DIAGNOSIS — K219 Gastro-esophageal reflux disease without esophagitis: Secondary | ICD-10-CM

## 2017-12-11 DIAGNOSIS — R1319 Other dysphagia: Secondary | ICD-10-CM

## 2017-12-11 DIAGNOSIS — M940 Chondrocostal junction syndrome [Tietze]: Secondary | ICD-10-CM

## 2017-12-11 HISTORY — DX: Diverticulosis of intestine, part unspecified, without perforation or abscess without bleeding: K57.90

## 2017-12-11 LAB — H. PYLORI ANTIBODY, IGG: H Pylori IgG: NEGATIVE

## 2017-12-11 MED ORDER — AZELASTINE HCL 0.15 % NA SOLN: 1.0000 | Freq: Every day | NASAL | 11 refills | Status: DC

## 2017-12-11 MED ORDER — LEVOCETIRIZINE DIHYDROCHLORIDE 5 MG PO TABS: 5.0000 mg | ORAL_TABLET | Freq: Every evening | ORAL | 11 refills | Status: DC

## 2017-12-11 MED ORDER — ZOLPIDEM TARTRATE 10 MG PO TABS: 10.0000 mg | ORAL_TABLET | Freq: Every evening | ORAL | 5 refills | Status: DC | PRN

## 2017-12-11 NOTE — Progress Notes (Signed)
Subjective  CC:  Chief Complaint  Patient presents with  . Establish Care  . Chest Pain  . Dysphagia  . Gastroesophageal Reflux  . Cough  . Insomnia    HPI: Ralph Phillips is a 57 y.o. male who presents to [place] at [facility] today to establish care with me as a new patient.  He has the following concerns or needs: former patient of Dr. Colin Benton. I have reviewed records. Last cpe with labs 07/2017.  57 year old Hispanic male with multiple medical problems and concerns to establish care today.  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  Has used Ambien in the past but stopped due to possible personality changes, although in retrospect he believes these were more related to irritability and anxiety due to stress from work.  He was most recently using trazodone from his PCP to help with sleep but found it to be ineffective.  He admits that he has a hard time falling asleep because his wife falls asleep easily and is a loud snorer.  He does not like to sleep away from her.  He also admits to thinking at night which keeps him awake.  He has a high stress job and his hours are long.  Due to global/international travel his circadian rhythms can get off.  He does report that Ambien helped him in the past.  He denies snoring.  GERD and postnasal drainage with persistent cough: He currently is taking over-the-counter antiacid for nighttime heartburn symptoms, but he reports having persistent phlegm and having to clear his throat often.  He uses Flonase but also uses nasal Afrin daily.  He has been using this for several months to year.  He was initially using 3 times a day but is now down to nightly.  He has never had a peptic ulcer, he denies lung disease, he is a former smoker.  He denies shortness of breath.  Dysphagia: He reports solids 10 does feel like they are getting stuck at the top of his  throat when he eats.  No problems with liquids.  No nausea vomiting diarrhea or abdominal pain.  Left lower chest wall pain evaluated by emergency room in [place] and here within the last month.  Had acute onset of chest wall pain with a popping sound due to vigorous coughing.  Since he has persistent pain with deep breathing and certain movements.  He was treated with Toradol and [place] and this helped significantly.  Now using anti-inflammatories as needed.  No shortness of breath.  Chest x-rays were negative for rib fracture or pulmonary disease.  Status post right carpal tunnel surgery by Dr. Amedeo Plenty.  Prediabetes: A1c was up to 6.3 in October.  He admits to a sweet tooth.  He has a typical Latino diet.  He is trying to increase vegetables and decrease starches.  His exercise routine which includes weightlifting and playing soccer has been limited due to his carpal tunnel problems and history of left arm surgery last year.  We updated and reviewed the patient's past history in detail and it is documented below.  Patient Active Problem List   Diagnosis Date Noted  . Diverticulosis 12/11/2017  . Obesity 09/20/2017  . Cervical radiculopathy 04/16/2017  . GAD (generalized anxiety disorder) 01/01/2017  . Insomnia 01/01/2017  . Chronic allergic rhinitis 01/01/2017  . Hypogonadism male 09/09/2013  . Prediabetes 09/09/2013  . Hypercholesteremia 11/29/2012   Health  Maintenance  Topic Date Due  . INFLUENZA VACCINE  06/10/2018 (Originally 05/23/2017)  . TETANUS/TDAP  01/29/2024  . COLONOSCOPY  06/24/2024  . Hepatitis C Screening  Completed  . HIV Screening  Addressed   Immunization History  Administered Date(s) Administered  . Influenza Split 08/16/2012  . Influenza,inj,Quad PF,6+ Mos 07/27/2015, 07/27/2016  . Tdap 01/28/2014   Current Meds  Medication Sig  . aspirin EC 81 MG tablet Take 81 mg by mouth daily.  . fluticasone (FLONASE) 50 MCG/ACT nasal spray Place 2 sprays into  both nostrils daily.   . Glucosamine-Chondroitin (GLUCOSAMINE CHONDR COMPLEX PO) Take by mouth.  . Multiple Vitamin (MULTIVITAMIN WITH MINERALS) TABS tablet Take 1 tablet by mouth daily. Miracle 2000  . Omega-3 Fatty Acids (FISH OIL PO) Take by mouth.  Marland Kitchen OVER THE COUNTER MEDICATION Take 1 capsule by mouth 2 (two) times daily. Prosvent dietary supplement - Vitamin D3, Zinc, Black Pepper, Nettle Root, Sterol Esters, Pumpkin Seed, Saw Palmetto, Pygeum Africanum and Lycopene  . OVER THE COUNTER MEDICATION Place 1 drop into both eyes daily as needed (dry eyes). Rohto eye drops  . PARoxetine (PAXIL) 20 MG tablet Take 20 mg by mouth daily.  . simvastatin (ZOCOR) 20 MG tablet TAKE 1 TABLET BY MOUTH EVERY DAY AT BEDTIME  . [DISCONTINUED] benzonatate (TESSALON) 100 MG capsule Take 1 capsule (100 mg total) by mouth every 8 (eight) hours.  . [DISCONTINUED] ketorolac (TORADOL) 10 MG tablet Take 1 tablet (10 mg total) by mouth every 6 (six) hours as needed.    Allergies: Patient has No Known Allergies. Past Medical History Patient  has a past medical history of Allergy, Diverticulosis (12/11/2017), GAD (generalized anxiety disorder), Hyperlipidemia, Lower urinary tract symptoms (LUTS), Medial meniscus tear (09/11/2012), Obesity, and Pre-diabetes. Past Surgical History Patient  has a past surgical history that includes Appendectomy (1974); Wrist surgery (1994); buttock surgery (1986); Shoulder surgery (1991); Facial reconstruction surgery (1968); and Biceps tendon repair (2018). Family History: Patient family history includes Alzheimer's disease in his father; Arthritis in his maternal grandmother and mother; Colon polyps (age of onset: 73) in his father; Heart disease in his paternal grandmother; Hyperlipidemia in his father; Stroke in his maternal grandfather. Social History:  Patient  reports that he quit smoking about 16 years ago. he has never used smokeless tobacco. He reports that he drinks alcohol. He  reports that he does not use drugs.  Review of Systems: Constitutional: negative for fever or malaise Ophthalmic: negative for photophobia, double vision or loss of vision Cardiovascular: negative for chest pain, dyspnea on exertion, or new LE swelling Respiratory: negative for SOB or persistent cough Gastrointestinal: negative for abdominal pain, change in bowel habits or melena Genitourinary: negative for dysuria or gross hematuria Musculoskeletal: negative for new gait disturbance or muscular weakness Integumentary: negative for new or persistent rashes Neurological: negative for TIA or stroke symptoms Psychiatric: negative for SI or delusions Allergic/Immunologic: negative for hives  Patient Care Team    Relationship Specialty Notifications Start End  Leamon Arnt, MD PCP - General Family Medicine  12/11/17   Roseanne Kaufman, MD Consulting Physician Orthopedic Surgery  12/11/17   Suella Broad, MD Consulting Physician Physical Medicine and Rehabilitation  12/11/17   Ardis Hughs, MD Attending Physician Urology  12/11/17   Irene Shipper, MD Consulting Physician Gastroenterology  12/11/17     Objective  Vitals: BP 122/84 (BP Location: Left Arm, Patient Position: Sitting, Cuff Size: Normal)   Pulse 87   Temp 98.3 F (36.8  C) (Oral)   Ht 5' 6.5" (1.689 m)   Wt 198 lb (89.8 kg)   BMI 31.48 kg/m   General:  Well developed, well nourished, no acute distress  Psych:  Alert and oriented,normal mood and affect HEENT:  Normocephalic, atraumatic, non-icteric sclera, PERRL, oropharynx is without mass or exudate, supple neck without adenopathy, mass or thyromegaly Cardiovascular:  RRR without gallop, rub or murmur, nondisplaced PMI Respiratory:  Good breath sounds bilaterally, CTAB with normal respiratory effort Gastrointestinal: normal bowel sounds, soft, non-tender, no noted masses. No HSM MSK: no deformities, contusions. Joints are without erythema or swelling Skin:  Warm, no  rashes or suspicious lesions noted Neurologic:    Mental status is normal. Gross motor and sensory exams are normal. Normal gait  Assessment  1. Adjustment insomnia   2. Diverticulosis of large intestine without hemorrhage   3. GAD (generalized anxiety disorder)   4. Esophageal dysphagia   5. Gastroesophageal reflux disease without esophagitis   6. Chronic cough   7. Slipped rib syndrome   8. Prediabetes      Plan   Insomnia: Given travel needs, will restart Ambien to be used as needed.  Recommend relaxation techniques, healthy nighttime routine, and sleep aids -background noise.  Recheck in 3 months.  General anxiety disorder with history of panic improved on Paxil.  Continue this medication.  Esophageal dysphagia with history of GERD: Start evaluation with upper GI series.  Recommend PPI daily for 6 weeks and H. pylori study.  May need referral to GI again for upper endoscopy if symptoms persist.  Chronic cough and allergies: Stop Afrin.  Change to Astelin, continue Flonase and add Xyzal.  Allergies and drainage could be contributing to chronic cough and dysphagia symptoms.  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  Pain meds as needed.  Consider a rib belt.  Encourage deep breathing.  Prediabetes: Work on decreasing starches in diet.  Get back to exercise when able.  Recheck A1c in 3 months.  Follow up: 3 months to recheck prediabetes, insomnia, dysphagia.  Commons side effects, risks, benefits, and alternatives for medications and treatment plan prescribed today were discussed, and the patient expressed understanding of the given instructions. Patient is instructed to call or message via MyChart if he/she has any questions or concerns regarding our treatment plan. No barriers to understanding were identified. We discussed Red Flag symptoms and signs in detail. Patient expressed understanding regarding what to do in case of urgent or emergency type symptoms.   Medication list was  reconciled, printed and provided to the patient in AVS. Patient instructions and summary information was reviewed with the patient as documented in the AVS. This note was prepared with assistance of Dragon voice recognition software. Occasional wrong-word or sound-a-like substitutions may have occurred due to the inherent limitations of voice recognition software  Orders Placed This Encounter  Procedures  . DG UGI W/HIGH DENSITY W/O KUB  . H. pylori antibody, IgG   Meds ordered this encounter  Medications  . zolpidem (AMBIEN) 10 MG tablet    Sig: Take 1 tablet (10 mg total) by mouth at bedtime as needed for sleep.    Dispense:  30 tablet    Refill:  5  . Azelastine HCl (ASTEPRO) 0.15 % SOLN    Sig: Place 1 spray into the nose daily.    Dispense:  30 mL    Refill:  11  . levocetirizine (XYZAL) 5 MG tablet    Sig: Take 1 tablet (5 mg total) by  mouth every evening.    Dispense:  30 tablet    Refill:  11

## 2017-12-11 NOTE — Patient Instructions (Signed)
Please return in 3 months to recheck your sugars and follow up on your sleep.  It was a pleasure meeting you today! Thank you for choosing [place] to meet your healthcare needs! I truly look forward to working with you. If you have any questions or concerns, please send me a message via Mychart or call the office at [phone].   Stress and Stress Management Stress is a normal reaction to life events. It is what you feel when life demands more than you are used to or more than you can handle. Some stress can be useful. For example, the stress reaction can help you catch the last bus of the day, study for a test, or meet a deadline at work. But stress that occurs too often or for too long can cause problems. It can affect your emotional health and interfere with relationships and normal daily activities. Too much stress can weaken your immune system and increase your risk for physical illness. If you already have a medical problem, stress can make it worse. What are the causes? All sorts of life events may cause stress. An event that causes stress for one person may not be stressful for another person. Major life events commonly cause stress. These may be positive or negative. Examples include losing your job, moving into a new home, getting married, having a baby, or losing a loved one. Less obvious life events may also cause stress, especially if they occur day after day or in combination. Examples include working long hours, driving in traffic, caring for children, being in debt, or being in a difficult relationship. What are the signs or symptoms? Stress may cause emotional symptoms including, the following:  Anxiety. This is feeling worried, afraid, on edge, overwhelmed, or out of control.  Anger. This is feeling irritated or impatient.  Depression. This is feeling sad, down, helpless, or guilty.  Difficulty focusing, remembering, or making decisions.  Stress may cause physical symptoms,  including the following:  Aches and pains. These may affect your head, neck, back, stomach, or other areas of your body.  Tight muscles or clenched jaw.  Low energy or trouble sleeping.  Stress may cause unhealthy behaviors, including the following:  Eating to feel better (overeating) or skipping meals.  Sleeping too little, too much, or both.  Working too much or putting off tasks (procrastination).  Smoking, drinking alcohol, or using drugs to feel better.  How is this diagnosed? Stress is diagnosed through an assessment by your health care provider. Your health care provider will ask questions about your symptoms and any stressful life events.Your health care provider will also ask about your medical history and may order blood tests or other tests. Certain medical conditions and medicine can cause physical symptoms similar to stress. Mental illness can cause emotional symptoms and unhealthy behaviors similar to stress. Your health care provider may refer you to a mental health professional for further evaluation. How is this treated? Stress management is the recommended treatment for stress.The goals of stress management are reducing stressful life events and coping with stress in healthy ways. Techniques for reducing stressful life events include the following:  Stress identification. Self-monitor for stress and identify what causes stress for you. These skills may help you to avoid some stressful events.  Time management. Set your priorities, keep a calendar of events, and learn to say "no." These tools can help you avoid making too many commitments.  Techniques for coping with stress include the following:  Rethinking the  problem. Try to think realistically about stressful events rather than ignoring them or overreacting. Try to find the positives in a stressful situation rather than focusing on the negatives.  Exercise. Physical exercise can release both physical and  emotional tension. The key is to find a form of exercise you enjoy and do it regularly.  Relaxation techniques. These relax the body and mind. Examples include yoga, meditation, tai chi, biofeedback, deep breathing, progressive muscle relaxation, listening to music, being out in nature, journaling, and other hobbies. Again, the key is to find one or more that you enjoy and can do regularly.  Healthy lifestyle. Eat a balanced diet, get plenty of sleep, and do not smoke. Avoid using alcohol or drugs to relax.  Strong support network. Spend time with family, friends, or other people you enjoy being around.Express your feelings and talk things over with someone you trust.  Counseling or talktherapy with a mental health professional may be helpful if you are having difficulty managing stress on your own. Medicine is typically not recommended for the treatment of stress.Talk to your health care provider if you think you need medicine for symptoms of stress. Follow these instructions at home:  Keep all follow-up visits as directed by your health care provider.  Take all medicines as directed by your health care provider. Contact a health care provider if:  Your symptoms get worse or you start having new symptoms.  You feel overwhelmed by your problems and can no longer manage them on your own. Get help right away if:  You feel like hurting yourself or someone else. This information is not intended to replace advice given to you by your health care provider. Make sure you discuss any questions you have with your health care provider. Document Released: 04/04/2001 Document Revised: 03/16/2016 Document Reviewed: 06/03/2013 Elsevier Interactive Patient Education  2017 [employer].

## 2017-12-14 DIAGNOSIS — R29898 Other symptoms and signs involving the musculoskeletal system: Secondary | ICD-10-CM | POA: Diagnosis not present

## 2017-12-20 ENCOUNTER — Ambulatory Visit: Payer: BLUE CROSS/BLUE SHIELD | Admitting: Family Medicine

## 2017-12-20 ENCOUNTER — Ambulatory Visit
Admission: RE | Admit: 2017-12-20 | Discharge: 2017-12-20 | Disposition: A | Discharge status: Home / Self Care | Payer: BLUE CROSS/BLUE SHIELD | Source: Ambulatory Visit | Attending: Family Medicine | Admitting: Family Medicine

## 2017-12-20 DIAGNOSIS — K449 Diaphragmatic hernia without obstruction or gangrene: Secondary | ICD-10-CM | POA: Diagnosis not present

## 2017-12-20 DIAGNOSIS — R29898 Other symptoms and signs involving the musculoskeletal system: Secondary | ICD-10-CM | POA: Diagnosis not present

## 2017-12-20 DIAGNOSIS — R1319 Other dysphagia: Secondary | ICD-10-CM

## 2017-12-20 DIAGNOSIS — R1310 Dysphagia, unspecified: Secondary | ICD-10-CM | POA: Diagnosis not present

## 2017-12-28 DIAGNOSIS — R29898 Other symptoms and signs involving the musculoskeletal system: Secondary | ICD-10-CM | POA: Diagnosis not present

## 2018-01-03 DIAGNOSIS — M79641 Pain in right hand: Secondary | ICD-10-CM | POA: Diagnosis not present

## 2018-01-09 DIAGNOSIS — G5601 Carpal tunnel syndrome, right upper limb: Secondary | ICD-10-CM | POA: Insufficient documentation

## 2018-01-20 ENCOUNTER — Other Ambulatory Visit: Payer: Self-pay | Admitting: Family Medicine

## 2018-01-21 ENCOUNTER — Other Ambulatory Visit: Payer: Self-pay | Admitting: Family Medicine

## 2018-02-07 ENCOUNTER — Ambulatory Visit: Payer: BLUE CROSS/BLUE SHIELD | Admitting: Family Medicine

## 2018-03-11 ENCOUNTER — Ambulatory Visit: Payer: BLUE CROSS/BLUE SHIELD | Admitting: Family Medicine

## 2018-03-19 ENCOUNTER — Ambulatory Visit: Payer: BLUE CROSS/BLUE SHIELD | Admitting: Family Medicine

## 2018-03-19 DIAGNOSIS — Z0289 Encounter for other administrative examinations: Secondary | ICD-10-CM

## 2018-03-23 ENCOUNTER — Encounter: Payer: Self-pay | Admitting: Family Medicine

## 2018-03-25 NOTE — Telephone Encounter (Signed)
Pt has been scheduled.  °

## 2018-04-03 ENCOUNTER — Ambulatory Visit (INDEPENDENT_AMBULATORY_CARE_PROVIDER_SITE_OTHER): Payer: BLUE CROSS/BLUE SHIELD | Admitting: Family Medicine

## 2018-04-03 ENCOUNTER — Other Ambulatory Visit: Payer: Self-pay

## 2018-04-03 ENCOUNTER — Encounter: Payer: Self-pay | Admitting: Family Medicine

## 2018-04-03 VITALS — BP 120/82 | HR 77 | Temp 97.8°F | Resp 16 | Ht 66.5 in | Wt 194.8 lb

## 2018-04-03 DIAGNOSIS — E785 Hyperlipidemia, unspecified: Secondary | ICD-10-CM | POA: Insufficient documentation

## 2018-04-03 DIAGNOSIS — R1319 Other dysphagia: Secondary | ICD-10-CM | POA: Insufficient documentation

## 2018-04-03 DIAGNOSIS — R7303 Prediabetes: Secondary | ICD-10-CM | POA: Diagnosis not present

## 2018-04-03 DIAGNOSIS — F5102 Adjustment insomnia: Secondary | ICD-10-CM

## 2018-04-03 DIAGNOSIS — E782 Mixed hyperlipidemia: Secondary | ICD-10-CM | POA: Diagnosis not present

## 2018-04-03 DIAGNOSIS — K219 Gastro-esophageal reflux disease without esophagitis: Secondary | ICD-10-CM

## 2018-04-03 DIAGNOSIS — R1310 Dysphagia, unspecified: Secondary | ICD-10-CM | POA: Diagnosis not present

## 2018-04-03 DIAGNOSIS — F411 Generalized anxiety disorder: Secondary | ICD-10-CM

## 2018-04-03 LAB — COMPREHENSIVE METABOLIC PANEL
ALBUMIN: 4.5 g/dL (ref 3.5–5.2)
ALK PHOS: 70 U/L (ref 39–117)
ALT: 20 U/L (ref 0–53)
AST: 17 U/L (ref 0–37)
BILIRUBIN TOTAL: 0.5 mg/dL (ref 0.2–1.2)
BUN: 24 mg/dL — ABNORMAL HIGH (ref 6–23)
CO2: 27 meq/L (ref 19–32)
CREATININE: 0.94 mg/dL (ref 0.40–1.50)
Calcium: 9.4 mg/dL (ref 8.4–10.5)
Chloride: 103 mEq/L (ref 96–112)
GFR: 88.57 mL/min (ref >60.00)
Glucose, Bld: 106 mg/dL — ABNORMAL HIGH (ref 70–99)
Potassium: 4.2 mEq/L (ref 3.5–5.1)
Sodium: 137 mEq/L (ref 135–145)
Total Protein: 7.2 g/dL (ref 6.0–8.3)

## 2018-04-03 LAB — LIPID PANEL
CHOL/HDL RATIO: 4
CHOLESTEROL: 191 mg/dL (ref 0–200)
HDL: 43.5 mg/dL (ref >39.00)
LDL Cholesterol: 115 mg/dL — ABNORMAL HIGH (ref 0–99)
NonHDL: 147.1
TRIGLYCERIDES: 162 mg/dL — AB (ref 0.0–149.0)
VLDL: 32.4 mg/dL (ref 0.0–40.0)

## 2018-04-03 LAB — POCT GLYCOSYLATED HEMOGLOBIN (HGB A1C): HEMOGLOBIN A1C: 5.7 % — AB (ref 4.0–5.6)

## 2018-04-03 NOTE — Progress Notes (Signed)
Subjective  CC:  Chief Complaint  Patient presents with  . Diabetes    Doing well  . Gastroesophageal Reflux    patient feels like he has an obstruction, worse after big meal    HPI: Ralph Phillips is a 57 y.o. male who presents to the office today to address the problems listed above in the chief complaint.  And patient here for follow-up of his prediabetes.  He reports he is changed his diet significantly.  Cut out sweets.  Is down 5 or 6 pounds.  Eating less starch.  Feeling well  Follow-up GERD: Negative H. pylori and upper GI study showing mild reflux without ulcerations or obstructions.  He completed Prilosec for 4 to 6 weeks without resolution of symptoms.  He changed to Zantac twice a day and feels that that is helping more.  However he continues to complain of difficulty swallowing in his upper esophagus.  Anxiety: Doing well on Paxil.  Continues with stressful job but coping well on medication.  Insomnia: Changed back to trazodone.  Stop Ambien.  This works well for him for travel.  Mixed hyperlipidemia: On statin.  Fasting today for recheck.  Assessment  1. GAD (generalized anxiety disorder)   2. Adjustment insomnia   3. Prediabetes   4. Mixed hyperlipidemia   5. Gastroesophageal reflux disease without esophagitis   6. Esophageal dysphagia      Plan   Anxiety and sleep are now well controlled.  Continue current medications  Prediabetes now resolved.  A1c is improved.  Continue with diet changes and weight loss.  Recheck hyperlipidemia on statin.  Check LFTs.  Refer to gastroenterology for persistent GERD/obstructive symptoms.  Consider EGD.  Continue H2 blocker  Follow up: Return in about 4 months (around 08/03/2018) for complete physical.   Orders Placed This Encounter  Procedures  . Comprehensive metabolic panel  . Lipid panel  . Ambulatory referral to Gastroenterology  . POCT glycosylated hemoglobin (Hb A1C)   No orders of the defined types  were placed in this encounter.     I reviewed the patients updated PMH, FH, and SocHx.    Patient Active Problem List   Diagnosis Date Noted  . Gastroesophageal reflux disease without esophagitis 04/03/2018  . Esophageal dysphagia 04/03/2018  . Mixed hyperlipidemia 04/03/2018  . Carpal tunnel syndrome of right wrist 01/09/2018  . Diverticulosis 12/11/2017  . Obesity 09/20/2017  . Cervical radiculopathy 04/16/2017  . GAD (generalized anxiety disorder) 01/01/2017  . Insomnia 01/01/2017  . Chronic allergic rhinitis 01/01/2017  . Hypogonadism male 09/09/2013  . Prediabetes 09/09/2013   Current Meds  Medication Sig  . Azelastine HCl (ASTEPRO) 0.15 % SOLN Place 1 spray into the nose daily.  . fluticasone (FLONASE) 50 MCG/ACT nasal spray Place 2 sprays into both nostrils daily.   . Glucosamine-Chondroitin (GLUCOSAMINE CHONDR COMPLEX PO) Take by mouth.  Marland Kitchen ibuprofen (ADVIL,MOTRIN) 600 MG tablet TAKE ONE TABLET BY MOUTH EVERY 6-8 HOURS AS NEEDED FOR PAIN  . levocetirizine (XYZAL) 5 MG tablet Take 1 tablet (5 mg total) by mouth every evening.  . Multiple Vitamin (MULTIVITAMIN WITH MINERALS) TABS tablet Take 1 tablet by mouth daily. Miracle 2000  . Omega-3 Fatty Acids (FISH OIL PO) Take by mouth.  Marland Kitchen OVER THE COUNTER MEDICATION Take 1 capsule by mouth 2 (two) times daily. Prosvent dietary supplement - Vitamin D3, Zinc, Black Pepper, Nettle Root, Sterol Esters, Pumpkin Seed, Saw Palmetto, Pygeum Africanum and Lycopene  . OVER THE COUNTER MEDICATION Place 1 drop into  both eyes daily as needed (dry eyes). Rohto eye drops  . PARoxetine (PAXIL) 20 MG tablet TAKE 1 TABLET (20 MG TOTAL) BY MOUTH DAILY.  . simvastatin (ZOCOR) 20 MG tablet TAKE 1 TABLET BY MOUTH EVERYDAY AT BEDTIME  . traZODone (DESYREL) 50 MG tablet trazodone 50 mg tablet    Allergies: Patient has No Known Allergies. Family History: Patient family history includes Alzheimer's disease in his father; Arthritis in his maternal  grandmother and mother; Colon polyps (age of onset: 20) in his father; Heart disease in his paternal grandmother; Hyperlipidemia in his father; Stroke in his maternal grandfather. Social History:  Patient  reports that he quit smoking about 16 years ago. He has never used smokeless tobacco. He reports that he drinks alcohol. He reports that he does not use drugs.  Review of Systems: Constitutional: Negative for fever malaise or anorexia Cardiovascular: negative for chest pain Respiratory: negative for SOB or persistent cough Gastrointestinal: negative for abdominal pain  Objective  Vitals: BP 120/82   Pulse 77   Temp 97.8 F (36.6 C) (Oral)   Resp 16   Ht 5' 6.5" (1.689 m)   Wt 194 lb 12.8 oz (88.4 kg)   SpO2 98%   BMI 30.97 kg/m  General: no acute distress , A&Ox3 HEENT: PEERL, conjunctiva normal, Oropharynx moist,neck is supple Cardiovascular:  RRR without murmur or gallop.  Respiratory:  Good breath sounds bilaterally, CTAB with normal respiratory effort Skin:  Warm, no rashes  Lab Results  Component Value Date   HGBA1C 5.7 (A) 04/03/2018   HGBA1C 6.3 08/21/2017   HGBA1C 6.1 07/27/2016     Lab Results  Component Value Date   CHOL 216 (H) 08/21/2017   HDL 56.40 08/21/2017   LDLCALC 121 (H) 08/21/2017   LDLDIRECT 94.4 04/15/2013   TRIG 197.0 (H) 08/21/2017   CHOLHDL 4 08/21/2017      Commons side effects, risks, benefits, and alternatives for medications and treatment plan prescribed today were discussed, and the patient expressed understanding of the given instructions. Patient is instructed to call or message via MyChart if he/she has any questions or concerns regarding our treatment plan. No barriers to understanding were identified. We discussed Red Flag symptoms and signs in detail. Patient expressed understanding regarding what to do in case of urgent or emergency type symptoms.   Medication list was reconciled, printed and provided to the patient in AVS.  Patient instructions and summary information was reviewed with the patient as documented in the AVS. This note was prepared with assistance of Dragon voice recognition software. Occasional wrong-word or sound-a-like substitutions may have occurred due to the inherent limitations of voice recognition software

## 2018-04-03 NOTE — Patient Instructions (Addendum)
Please return in months for your annual complete physical; please come fasting.  We will call you with information regarding your referral appointment. Gastroenterology.  If you do not hear from [place] within the next 2 weeks, please let me know. It can take 1-2 weeks to get appointments set up with the specialists.   I will release your lab results to you on your MyChart account with further instructions. Please reply with any questions.   Good work on [employer]!  If you have any questions or concerns, please don't hesitate to send me a message via MyChart or call the office at [phone]. Thank you for visiting with [place] today! It's our pleasure caring for you.   Fat and Cholesterol Restricted Diet Getting too much fat and cholesterol in your diet may cause health problems. Following this diet helps keep your fat and cholesterol at normal levels. This can keep you from getting sick. What types of fat should I choose?  Choose monosaturated and polyunsaturated fats. These are found in foods such as olive oil, canola oil, flaxseeds, walnuts, almonds, and seeds.  Eat more omega-3 fats. Good choices include salmon, mackerel, sardines, tuna, flaxseed oil, and ground flaxseeds.  Limit saturated fats. These are in animal products such as meats, butter, and cream. They can also be in plant products such as palm oil, palm kernel oil, and coconut oil.  Avoid foods with partially hydrogenated oils in them. These contain trans fats. Examples of foods that have trans fats are stick margarine, some tub margarines, cookies, crackers, and other baked goods. What general guidelines do I need to follow?  Check food labels. Look for the words "trans fat" and "saturated fat."  When preparing a meal: ? Fill half of your plate with vegetables and green salads. ? Fill one fourth of your plate with whole grains. Look for the word "whole" as the first word in the ingredient list. ? Fill one fourth of your  plate with lean protein foods.  Eat more foods that have fiber, like apples, carrots, beans, peas, and barley.  Eat more home-cooked foods. Eat less at restaurants and buffets.  Limit or avoid alcohol.  Limit foods high in starch and sugar.  Limit fried foods.  Cook foods without frying them. Baking, boiling, grilling, and broiling are all great options.  Lose weight if you are overweight. Losing even a small amount of weight can help your overall health. It can also help prevent diseases such as diabetes and heart disease. What foods can I eat? Grains Whole grains, such as whole wheat or whole grain breads, crackers, cereals, and pasta. Unsweetened oatmeal, bulgur, barley, quinoa, or brown rice. Corn or whole wheat flour tortillas. Vegetables Fresh or frozen vegetables (raw, steamed, roasted, or grilled). Green salads. Fruits All fresh, canned (in natural juice), or frozen fruits. Meat and Other Protein Products Ground beef (85% or leaner), grass-fed beef, or beef trimmed of fat. Skinless chicken or [place]. Ground chicken or [place]. Pork trimmed of fat. All fish and seafood. Eggs. Dried beans, peas, or lentils. Unsalted nuts or seeds. Unsalted canned or dry beans. Dairy Low-fat dairy products, such as skim or 1% milk, 2% or reduced-fat cheeses, low-fat ricotta or cottage cheese, or plain low-fat yogurt. Fats and Oils Tub margarines without trans fats. Light or reduced-fat mayonnaise and salad dressings. Avocado. Olive, canola, sesame, or safflower oils. Natural peanut or almond butter (choose ones without added sugar and oil). The items listed above may not be a complete  list of recommended foods or beverages. Contact your dietitian for more options. What foods are not recommended? Grains White bread. White pasta. White rice. Cornbread. Bagels, pastries, and croissants. Crackers that contain trans fat. Vegetables White potatoes. Corn. Creamed or fried vegetables. Vegetables in a  cheese sauce. Fruits Dried fruits. Canned fruit in light or heavy syrup. Fruit juice. Meat and Other Protein Products Fatty cuts of meat. Ribs, chicken wings, bacon, sausage, bologna, salami, chitterlings, fatback, hot dogs, bratwurst, and packaged luncheon meats. Liver and organ meats. Dairy Whole or 2% milk, cream, half-and-half, and cream cheese. Whole milk cheeses. Whole-fat or sweetened yogurt. Full-fat cheeses. Nondairy creamers and whipped toppings. Processed cheese, cheese spreads, or cheese curds. Sweets and Desserts Corn syrup, sugars, honey, and molasses. Candy. Jam and jelly. Syrup. Sweetened cereals. Cookies, pies, cakes, donuts, muffins, and ice cream. Fats and Oils Butter, stick margarine, lard, shortening, ghee, or bacon fat. Coconut, palm kernel, or palm oils. Beverages Alcohol. Sweetened drinks (such as sodas, lemonade, and fruit drinks or punches). The items listed above may not be a complete list of foods and beverages to avoid. Contact your dietitian for more information. This information is not intended to replace advice given to you by your health care provider. Make sure you discuss any questions you have with your health care provider. Document Released: 04/09/2012 Document Revised: 06/15/2016 Document Reviewed: 01/08/2014 Elsevier Interactive Patient Education  [employer].

## 2018-04-12 ENCOUNTER — Other Ambulatory Visit: Payer: Self-pay | Admitting: Family Medicine

## 2018-04-22 ENCOUNTER — Other Ambulatory Visit: Payer: Self-pay | Admitting: Family Medicine

## 2018-04-26 ENCOUNTER — Other Ambulatory Visit: Payer: Self-pay | Admitting: Family Medicine

## 2018-04-30 ENCOUNTER — Other Ambulatory Visit: Payer: Self-pay | Admitting: Family Medicine

## 2018-05-01 NOTE — Telephone Encounter (Signed)
Received and reviewed medication refill request.  Request is appropriate and was approved.  Please see medication orders for details. Refilled 90 x 3 RF.

## 2018-05-08 IMAGING — DX DG CHEST 2V
2 series · 2 of 2 positions shown · non-contrast
Comparison: 11/27/2016, 11/14/2011.

CLINICAL DATA: Two week history of chills, night sweats, hemoptysis
and nasal congestion after a recent trip to [place]. Former smoker.

EXAM:
CHEST  2 VIEW

[chest pa]
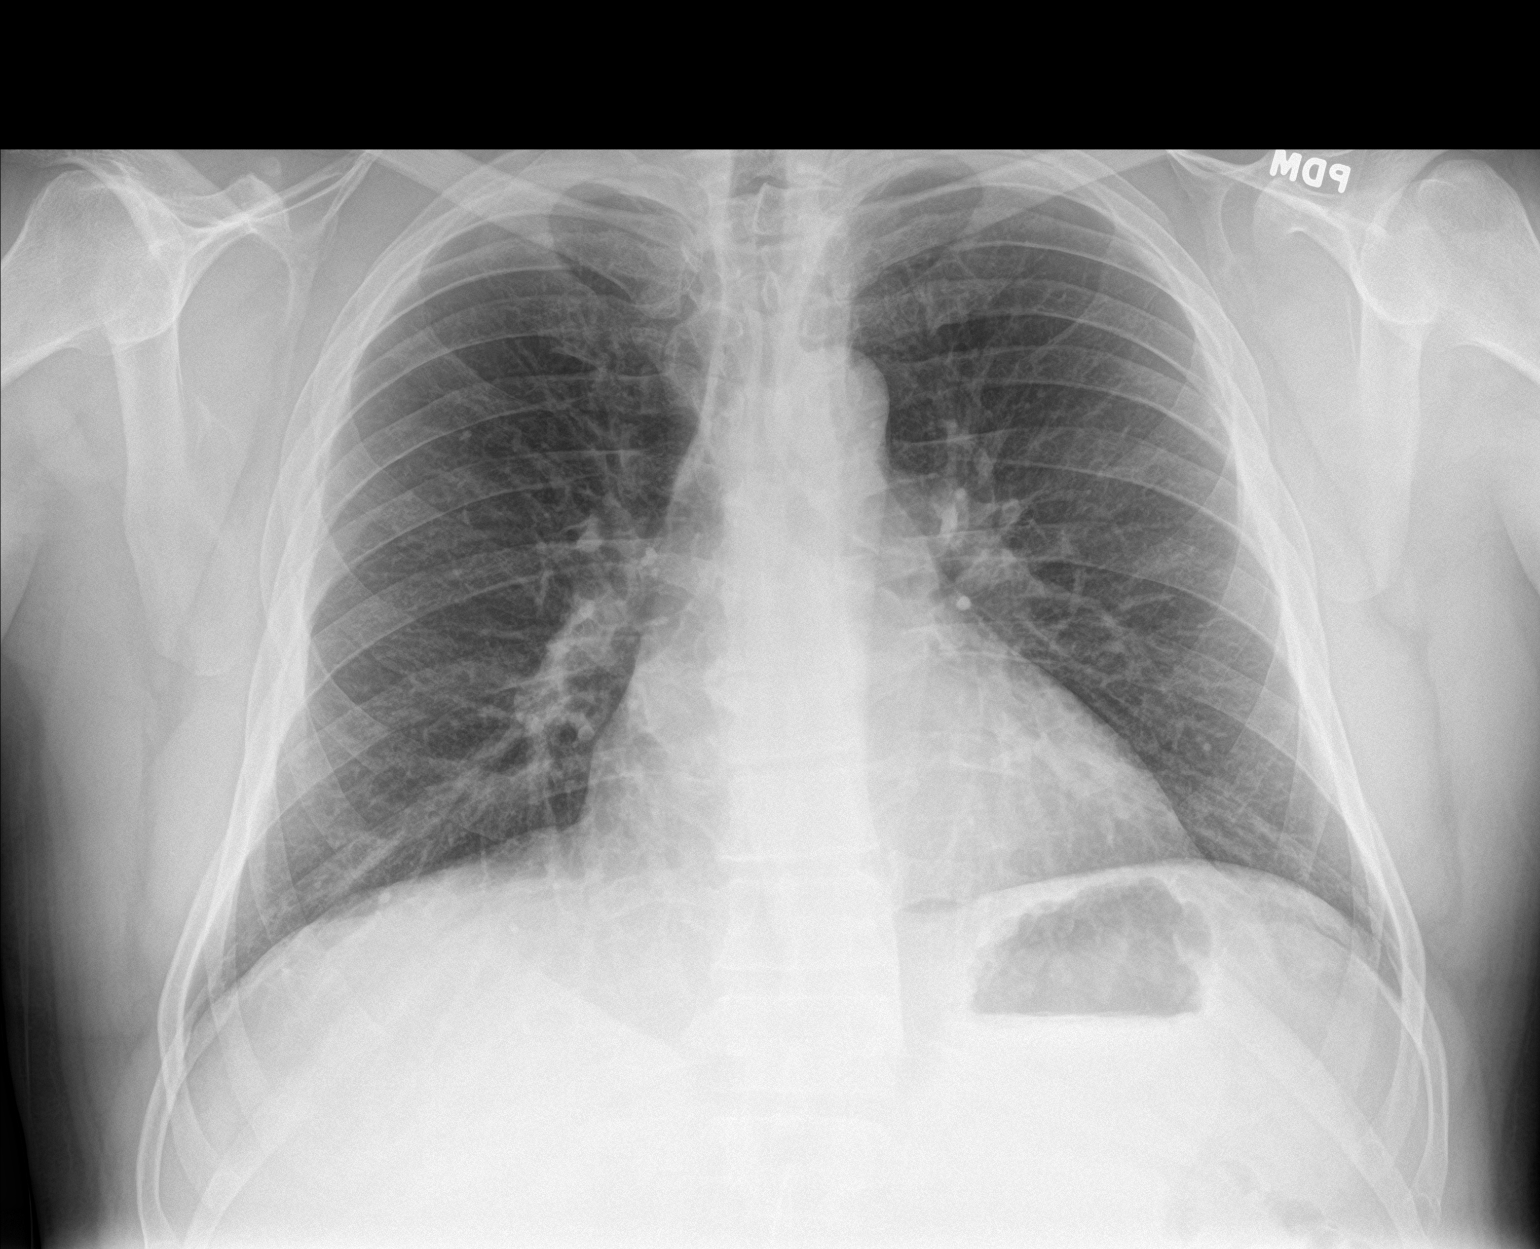

[chest lat]
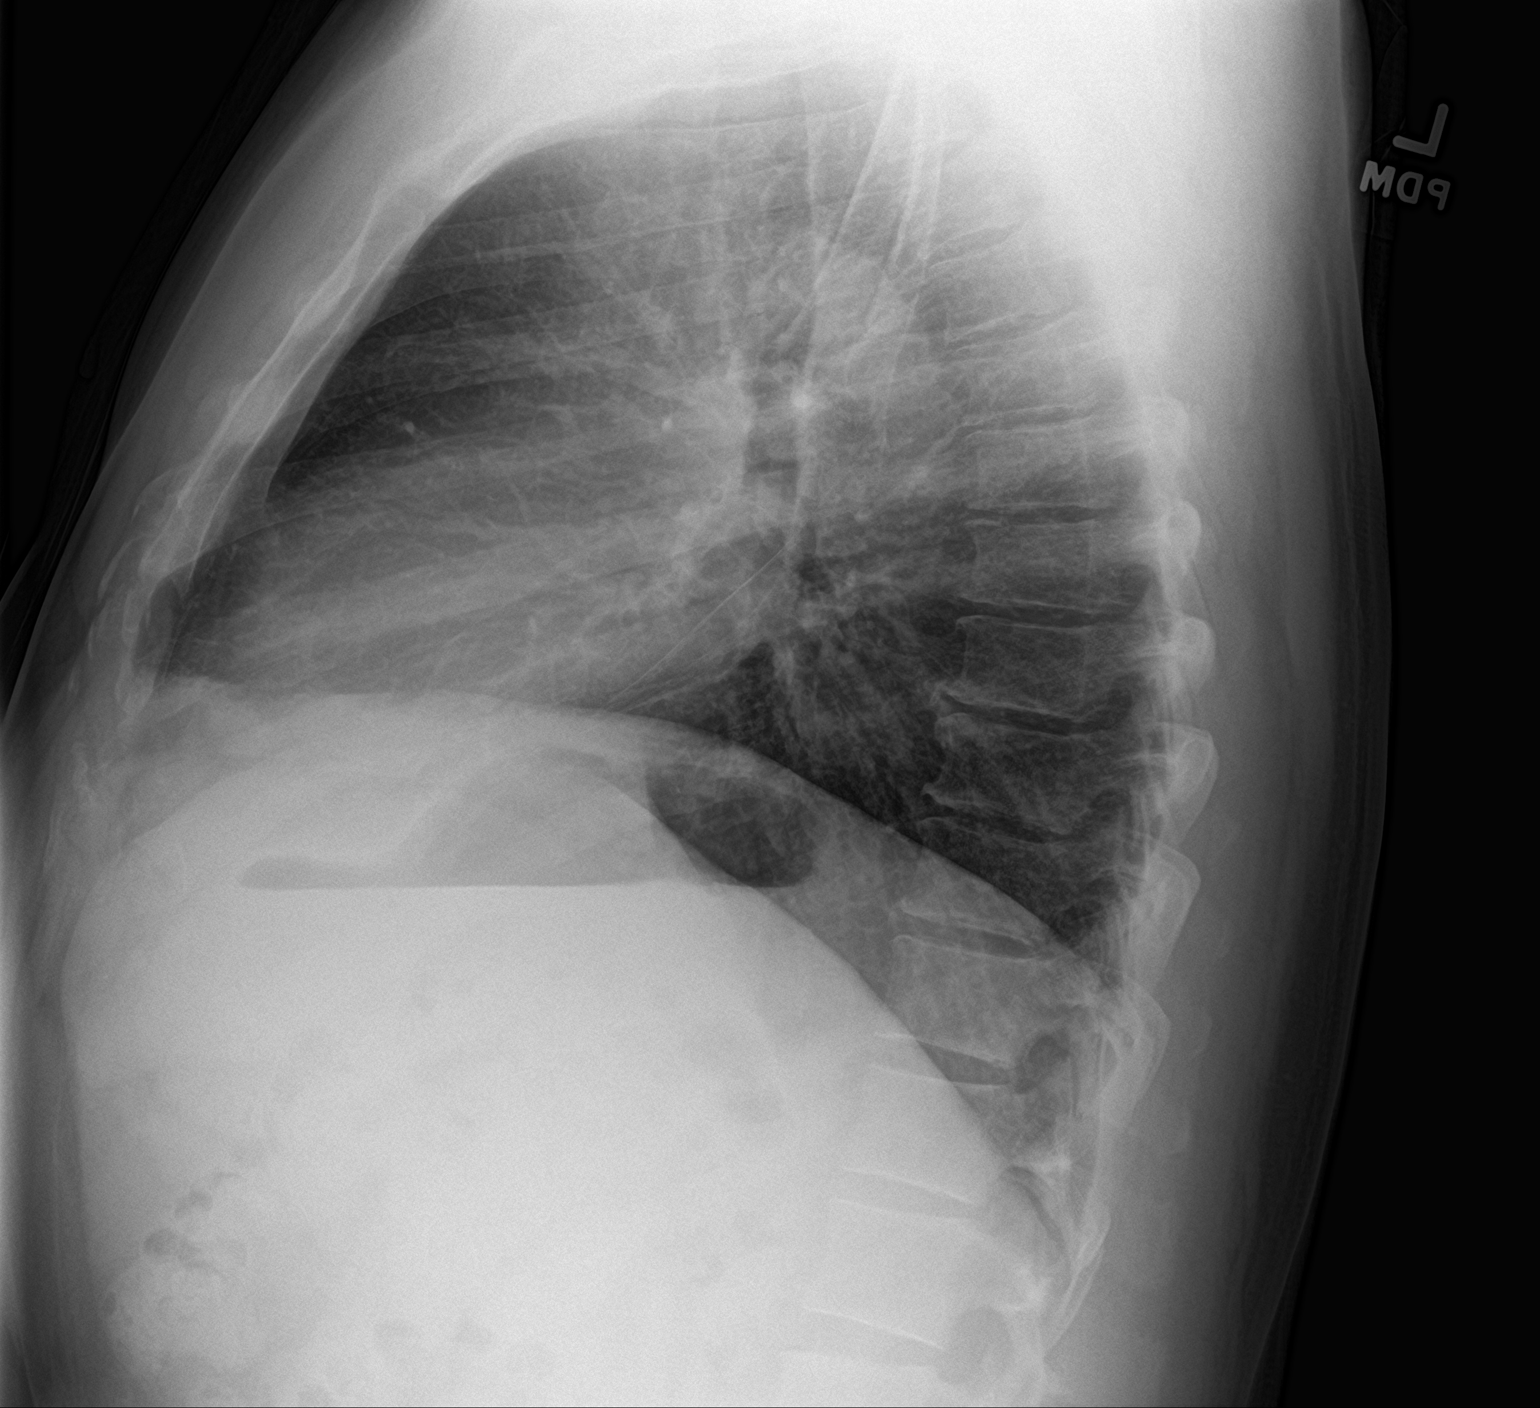

[2 of 2 positions shown; findings below may reference images not displayed]

FINDINGS: Cardiomediastinal silhouette unremarkable, unchanged. Lungs clear.
Bronchovascular markings normal. Pulmonary vascularity normal. No
visible pleural effusions. No pneumothorax. Mild degenerative
changes involving the thoracic spine.
IMPRESSION: No acute cardiopulmonary disease.

## 2018-05-14 ENCOUNTER — Ambulatory Visit (INDEPENDENT_AMBULATORY_CARE_PROVIDER_SITE_OTHER): Payer: BLUE CROSS/BLUE SHIELD | Admitting: Internal Medicine

## 2018-05-14 ENCOUNTER — Encounter: Payer: Self-pay | Admitting: Internal Medicine

## 2018-05-14 VITALS — BP 124/86 | HR 72 | Ht 66.5 in | Wt 193.2 lb

## 2018-05-14 DIAGNOSIS — K219 Gastro-esophageal reflux disease without esophagitis: Secondary | ICD-10-CM

## 2018-05-14 DIAGNOSIS — R1310 Dysphagia, unspecified: Secondary | ICD-10-CM

## 2018-05-14 DIAGNOSIS — R935 Abnormal findings on diagnostic imaging of other abdominal regions, including retroperitoneum: Secondary | ICD-10-CM | POA: Diagnosis not present

## 2018-05-14 MED ORDER — OMEPRAZOLE 40 MG PO CPDR
40.0000 mg | DELAYED_RELEASE_CAPSULE | Freq: Every day | ORAL | 6 refills | Status: DC
Start: 2018-05-14 — End: 2018-09-17
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
## 2018-05-14 NOTE — Patient Instructions (Signed)
We have sent the following medications to your pharmacy for you to pick up at your convenience:  Omeprazole  You have been scheduled for an endoscopy. Please follow written instructions given to you at your visit today. If you use inhalers (even only as needed), please bring them with you on the day of your procedure. Your physician has requested that you go to www.startemmi.com and enter the access code given to you at your visit today. This web site gives a general overview about your procedure. However, you should still follow specific instructions given to you by our office regarding your preparation for the procedure.   

## 2018-05-30 ENCOUNTER — Telehealth: Payer: Self-pay

## 2018-05-30 MED ORDER — ESOMEPRAZOLE MAGNESIUM 40 MG PO CPDR: 40.0000 mg | DELAYED_RELEASE_CAPSULE | Freq: Every day | ORAL | 6 refills | Status: DC

## 2018-05-30 NOTE — Telephone Encounter (Signed)
Omeprazole not covered by patient's insurance.  Patient requested to be switched to Esomeprazole, which is covered.  Esomeprazole sent to pharmacy

## 2018-06-07 ENCOUNTER — Ambulatory Visit (AMBULATORY_SURGERY_CENTER): Payer: BLUE CROSS/BLUE SHIELD | Admitting: Internal Medicine

## 2018-06-07 ENCOUNTER — Encounter: Payer: Self-pay | Admitting: Internal Medicine

## 2018-06-07 VITALS — BP 121/75 | HR 62 | Temp 99.3°F | Resp 13 | Ht 66.5 in | Wt 193.0 lb

## 2018-06-07 DIAGNOSIS — K222 Esophageal obstruction: Secondary | ICD-10-CM

## 2018-06-07 DIAGNOSIS — K2990 Gastroduodenitis, unspecified, without bleeding: Secondary | ICD-10-CM

## 2018-06-07 DIAGNOSIS — K449 Diaphragmatic hernia without obstruction or gangrene: Secondary | ICD-10-CM | POA: Diagnosis not present

## 2018-06-07 DIAGNOSIS — R1310 Dysphagia, unspecified: Secondary | ICD-10-CM | POA: Diagnosis not present

## 2018-06-07 DIAGNOSIS — K259 Gastric ulcer, unspecified as acute or chronic, without hemorrhage or perforation: Secondary | ICD-10-CM

## 2018-06-07 DIAGNOSIS — K219 Gastro-esophageal reflux disease without esophagitis: Secondary | ICD-10-CM

## 2018-06-07 DIAGNOSIS — K2970 Gastritis, unspecified, without bleeding: Secondary | ICD-10-CM

## 2018-06-07 MED ORDER — SODIUM CHLORIDE 0.9 % IV SOLN: 500.0000 mL | INTRAVENOUS | Status: DC

## 2018-06-07 NOTE — Progress Notes (Signed)
To PACU, VSS. Report to [email or web address] 

## 2018-06-07 NOTE — Op Note (Signed)
[facility] Patient Name: Ralph Phillips Procedure Date: 06/07/2018 10:18 AM MRN: [id number] Endoscopist: Docia Chuck. Henrene Pastor , MD Age: 57 Referring MD:  Date of Birth: [birth year] Gender: Male Account #: [id number] Procedure:                Upper GI endoscopy, with biopsy and Maloney                            dilation of the esophagus 40F Indications:              Dysphagia, Esophageal reflux Medicines:                Monitored Anesthesia Care Procedure:                Pre-Anesthesia Assessment:                           - Prior to the procedure, a History and Physical                            was performed, and patient medications and                            allergies were reviewed. The patient's tolerance of                            previous anesthesia was also reviewed. The risks                            and benefits of the procedure and the sedation                            options and risks were discussed with the patient.                            All questions were answered, and informed consent                            was obtained. Prior Anticoagulants: The patient has                            taken no previous anticoagulant or antiplatelet                            agents. ASA Grade Assessment: II - A patient with                            mild systemic disease. After reviewing the risks                            and benefits, the patient was deemed in                            satisfactory condition to undergo the procedure.  After obtaining informed consent, the endoscope was                            passed under direct vision. Throughout the                            procedure, the patient's blood pressure, pulse, and                            oxygen saturations were monitored continuously. The                            Endoscope was introduced through the mouth, and                            advanced to the  second part of duodenum. The upper                            GI endoscopy was accomplished without difficulty.                            The patient tolerated the procedure well. Scope In: Scope Out: Findings:                 The esophageal lining was normal. No active                            inflammation or endoscopic Barrett's .One                            benign-appearing, intrinsic mild stenosis was found                            39 cm from the incisors. The scope was withdrawn,                            after completing the endoscopic survey. Dilation                            was performed with a Maloney dilator with no                            resistance at 15 Fr.                           There was a small sliding hiatal hernia.                           The stomach revealed erythema and superficial                            erosions in the region of the body/antrum. Biopsies                            were  taken with a cold forceps for Helicobacter                            pylori testing using CLOtest.                           The examined duodenum was normal.                           The cardia and gastric fundus were normal on                            retroflexion. Complications:            No immediate complications. Estimated Blood Loss:     Estimated blood loss: none. Impression:               1. GERD with esophageal stricture status post                            dilation                           2. Mild gastritis status post CLO biopsy Recommendation:           - Patient has a contact number available for                            emergencies. The signs and symptoms of potential                            delayed complications were discussed with the                            patient. Return to normal activities tomorrow.                            Written discharge instructions were provided to the                            patient.                            - Post dilation diet.                           - Reflux precautions with attention to weight loss                           - Continue omeprazole 40 mg daily.                           - Await pathology results.                           - Follow-up in the office with Dr. Henrene Pastor in about 3  months Docia Chuck. Henrene Pastor, MD 06/07/2018 10:40:49 AM This report has been signed electronically.

## 2018-06-07 NOTE — Patient Instructions (Signed)
Discharge instructions. Handouts on a dilatation diet, hiatal hernia and gastritis. Resume previous medications. YOU HAD AN ENDOSCOPIC PROCEDURE TODAY AT [facility]:   Refer to the procedure report that was given to you for any specific questions about what was found during the examination.  If the procedure report does not answer your questions, please call your gastroenterologist to clarify.  If you requested that your care partner not be given the details of your procedure findings, then the procedure report has been included in a sealed envelope for you to review at your convenience later.  YOU SHOULD EXPECT: Some feelings of bloating in the abdomen. Passage of more gas than usual.  Walking can help get rid of the air that was put into your GI tract during the procedure and reduce the bloating. If you had a lower endoscopy (such as a colonoscopy or flexible sigmoidoscopy) you may notice spotting of blood in your stool or on the toilet paper. If you underwent a bowel prep for your procedure, you may not have a normal bowel movement for a few days.  Please Note:  You might notice some irritation and congestion in your nose or some drainage.  This is from the oxygen used during your procedure.  There is no need for concern and it should clear up in a day or so.  SYMPTOMS TO REPORT IMMEDIATELY:    Following upper endoscopy (EGD)  Vomiting of blood or coffee ground material  New chest pain or pain under the shoulder blades  Painful or persistently difficult swallowing  New shortness of breath  Fever of 100F or higher  Black, tarry-looking stools  For urgent or emergent issues, a gastroenterologist can be reached at any hour by calling [phone].   DIET:  We do recommend a small meal at first, but then you may proceed to your regular diet.  Drink plenty of fluids but you should avoid alcoholic beverages for 24 hours.  ACTIVITY:  You should plan to take it easy for the  rest of today and you should NOT DRIVE or use heavy machinery until tomorrow (because of the sedation medicines used during the test).    FOLLOW UP: Our staff will call the number listed on your records the next business day following your procedure to check on you and address any questions or concerns that you may have regarding the information given to you following your procedure. If we do not reach you, we will leave a message.  However, if you are feeling well and you are not experiencing any problems, there is no need to return our call.  We will assume that you have returned to your regular daily activities without incident.  If any biopsies were taken you will be contacted by phone or by letter within the next 1-3 weeks.  Please call us at [phone] if you have not heard about the biopsies in 3 weeks.    SIGNATURES/CONFIDENTIALITY: You and/or your care partner have signed paperwork which will be entered into your electronic medical record.  These signatures attest to the fact that that the information above on your After Visit Summary has been reviewed and is understood.  Full responsibility of the confidentiality of this discharge information lies with you and/or your care-partner.

## 2018-06-07 NOTE — Progress Notes (Signed)
Called to room to assist during endoscopic procedure.  Patient ID and intended procedure confirmed with present staff. Received instructions for my participation in the procedure from the performing physician.  

## 2018-06-08 ENCOUNTER — Other Ambulatory Visit: Payer: Self-pay | Admitting: Internal Medicine

## 2018-06-10 ENCOUNTER — Telehealth: Payer: Self-pay

## 2018-06-10 LAB — HELICOBACTER PYLORI SCREEN-BIOPSY: UREASE: NEGATIVE

## 2018-06-10 NOTE — Telephone Encounter (Signed)
  Follow up Call-  Call back number 06/07/2018  Post procedure Call Back phone  # [phone]  Permission to leave phone message Yes  Some recent data might be hidden     Patient questions:  Do you have a fever, pain , or abdominal swelling? No. Pain Score  0 *  Have you tolerated food without any problems? Yes.    Have you been able to return to your normal activities? Yes.    Do you have any questions about your discharge instructions: Diet   No. Medications  No. Follow up visit  No.  Do you have questions or concerns about your Care? Yes.  patient still have slight difficulty swallowing. Advised to give a couple more days and if still have issues to call JP  Actions: * If pain score is 4 or above: No action needed, pain <4.

## 2018-06-11 ENCOUNTER — Other Ambulatory Visit: Payer: Self-pay | Admitting: Family Medicine

## 2018-06-11 NOTE — Telephone Encounter (Signed)
Last OV 04/03/18 Zolpidem last filled 12/11/17 #30 with 5

## 2018-06-14 NOTE — Telephone Encounter (Signed)
Received and reviewed medication refill request.  Request is appropriate and was approved.  Please see medication orders for details.  

## 2018-06-18 DIAGNOSIS — G5602 Carpal tunnel syndrome, left upper limb: Secondary | ICD-10-CM | POA: Insufficient documentation

## 2018-06-25 DIAGNOSIS — M25511 Pain in right shoulder: Secondary | ICD-10-CM | POA: Diagnosis not present

## 2018-06-25 DIAGNOSIS — M25571 Pain in right ankle and joints of right foot: Secondary | ICD-10-CM | POA: Insufficient documentation

## 2018-06-25 DIAGNOSIS — G5602 Carpal tunnel syndrome, left upper limb: Secondary | ICD-10-CM | POA: Diagnosis not present

## 2018-06-25 DIAGNOSIS — Z4789 Encounter for other orthopedic aftercare: Secondary | ICD-10-CM | POA: Diagnosis not present

## 2018-07-01 DIAGNOSIS — G5602 Carpal tunnel syndrome, left upper limb: Secondary | ICD-10-CM | POA: Diagnosis not present

## 2018-07-04 DIAGNOSIS — M25571 Pain in right ankle and joints of right foot: Secondary | ICD-10-CM | POA: Diagnosis not present

## 2018-07-10 ENCOUNTER — Telehealth: Payer: Self-pay | Admitting: Emergency Medicine

## 2018-07-10 NOTE — Telephone Encounter (Addendum)
Appt scheduled for tomorrow 07/11/2018

## 2018-07-10 NOTE — Telephone Encounter (Addendum)
CRM for notification. See Telephone encounter for: 07/10/18.  Pt called in because he stated that he had a fall. Pt is requesting to only speak with PCP.    [phone]   Patient called for an appointment and states he wants to see Dr. Jonni Sanger for a Fall that occurred on Monday. He states he is better today but wants to be seen.  Appt scheduled for 07/11/2018

## 2018-07-11 ENCOUNTER — Ambulatory Visit (INDEPENDENT_AMBULATORY_CARE_PROVIDER_SITE_OTHER): Payer: BLUE CROSS/BLUE SHIELD | Admitting: Family Medicine

## 2018-07-11 ENCOUNTER — Other Ambulatory Visit: Payer: Self-pay

## 2018-07-11 ENCOUNTER — Encounter: Payer: Self-pay | Admitting: Family Medicine

## 2018-07-11 VITALS — BP 120/80 | HR 77 | Temp 98.1°F | Ht 66.5 in | Wt 195.0 lb

## 2018-07-11 DIAGNOSIS — H8110 Benign paroxysmal vertigo, unspecified ear: Secondary | ICD-10-CM | POA: Diagnosis not present

## 2018-07-11 MED ORDER — MECLIZINE HCL 25 MG PO TABS: 25.0000 mg | ORAL_TABLET | Freq: Three times a day (TID) | ORAL | 0 refills | Status: DC | PRN

## 2018-07-11 NOTE — Progress Notes (Signed)
Subjective  CC:  Chief Complaint  Patient presents with  . Dizziness    went to stand up out of bed yesterday and fell, c/o headache and dizziness    HPI: Ralph Phillips is a 57 y.o. male who presents to the office today to address the problems listed above in the chief complaint.  Awoke yesterday and experienced acute vertigo upon getting out of bed.  He felt dizzy and fell to the floor.  Fell against the wall.  No loss of consciousness.  Had to lie still for several minutes before he felt better.  Throughout the day, he continued to have vertigo with head movement.  No syncope.  No diplopia, dysarthria, hemiparesis.  Denies headache.  No similar events like this in the past.  He did have a significant URI several months back.  No recent illness.  Today feeling a bit better but moving slowly.  Mild nausea with first event but none since.  No vomiting.  Assessment  1. Benign paroxysmal positional vertigo, unspecified laterality      Plan   Positional vertigo: Educated regarding etiology, prognosis and management strategies.  Fall prevention and caution advised.  Meclizine ordered.  Follow-up if unimproved.  No red flags identified  Follow up: Return for as scheduled, complete physical.   No orders of the defined types were placed in this encounter.  Meds ordered this encounter  Medications  . meclizine (ANTIVERT) 25 MG tablet    Sig: Take 1 tablet (25 mg total) by mouth 3 (three) times daily as needed for dizziness.    Dispense:  30 tablet    Refill:  0      I reviewed the patients updated PMH, FH, and SocHx.    Patient Active Problem List   Diagnosis Date Noted  . Pain in joint of right ankle 06/25/2018  . Pain in joint of right shoulder 06/25/2018  . Carpal tunnel syndrome of left wrist 06/18/2018  . Gastroesophageal reflux disease without esophagitis 04/03/2018  . Esophageal dysphagia 04/03/2018  . Mixed hyperlipidemia 04/03/2018  . Carpal tunnel syndrome of  right wrist 01/09/2018  . Diverticulosis 12/11/2017  . Obesity 09/20/2017  . Cervical radiculopathy 04/16/2017  . GAD (generalized anxiety disorder) 01/01/2017  . Insomnia 01/01/2017  . Chronic allergic rhinitis 01/01/2017  . Hypogonadism male 09/09/2013  . Prediabetes 09/09/2013   Current Meds  Medication Sig  . Azelastine HCl (ASTEPRO) 0.15 % SOLN Place 1 spray into the nose daily.  . fluticasone (FLONASE) 50 MCG/ACT nasal spray Place 2 sprays into both nostrils daily.   . Glucosamine-Chondroitin (GLUCOSAMINE CHONDR COMPLEX PO) Take by mouth daily.   Marland Kitchen levocetirizine (XYZAL) 5 MG tablet Take 1 tablet (5 mg total) by mouth every evening.  . Multiple Vitamin (MULTIVITAMIN WITH MINERALS) TABS tablet Take 1 tablet by mouth daily. Miracle 2000  . omeprazole (PRILOSEC) 40 MG capsule Take 1 capsule (40 mg total) by mouth daily.  Marland Kitchen OVER THE COUNTER MEDICATION Take 1 capsule by mouth 2 (two) times daily. Prosvent dietary supplement - Vitamin D3, Zinc, Black Pepper, Nettle Root, Sterol Esters, Pumpkin Seed, Saw Palmetto, Pygeum Africanum and Lycopene  . OVER THE COUNTER MEDICATION Place 1 drop into both eyes daily as needed (dry eyes). Rohto eye drops  . PARoxetine (PAXIL) 20 MG tablet TAKE 1 TABLET BY MOUTH EVERY DAY  . simvastatin (ZOCOR) 20 MG tablet TAKE 1 TABLET BY MOUTH EVERYDAY AT BEDTIME  . zolpidem (AMBIEN) 10 MG tablet TAKE 1 TABLET (10 MG TOTAL)  BY MOUTH AT BEDTIME AS NEEDED FOR SLEEP.  . [DISCONTINUED] ibuprofen (ADVIL,MOTRIN) 600 MG tablet TAKE ONE TABLET BY MOUTH EVERY 6-8 HOURS AS NEEDED FOR PAIN    Allergies: Patient has No Known Allergies. Family History: Patient family history includes Alzheimer's disease in his father; Arthritis in his maternal grandmother and mother; Colon cancer in his maternal uncle; Colon polyps (age of onset: 47) in his father; Heart disease in his paternal grandmother; Hyperlipidemia in his father; Parkinson's disease in his mother; Stroke in his  maternal grandfather. Social History:  Patient  reports that he quit smoking about 16 years ago. He has never used smokeless tobacco. He reports that he drinks alcohol. He reports that he does not use drugs.  Review of Systems: Constitutional: Negative for fever malaise or anorexia Cardiovascular: negative for chest pain Respiratory: negative for SOB or persistent cough Gastrointestinal: negative for abdominal pain  Objective  Vitals: BP 120/80   Pulse 77   Temp 98.1 F (36.7 C)   Ht 5' 6.5" (1.689 m)   Wt 195 lb (88.5 kg)   SpO2 98%   BMI 31.00 kg/m  General: no acute distress , A&Ox3, appears well HEENT: PEERL, conjunctiva normal, Oropharynx moist,neck is supple, normal-appearing TMs, Cardiovascular:  RRR without murmur or gallop.  Respiratory:  Good breath sounds bilaterally, CTAB with normal respiratory effort Skin:  Warm, no rashes Neuro: Cranial nerves II through XII intact, no nystagmus, dizziness with lateral head movement.  Normal DTRs, normal finger-to-nose bilaterally, normal gait    Commons side effects, risks, benefits, and alternatives for medications and treatment plan prescribed today were discussed, and the patient expressed understanding of the given instructions. Patient is instructed to call or message via MyChart if he/she has any questions or concerns regarding our treatment plan. No barriers to understanding were identified. We discussed Red Flag symptoms and signs in detail. Patient expressed understanding regarding what to do in case of urgent or emergency type symptoms.   Medication list was reconciled, printed and provided to the patient in AVS. Patient instructions and summary information was reviewed with the patient as documented in the AVS. This note was prepared with assistance of Dragon voice recognition software. Occasional wrong-word or sound-a-like substitutions may have occurred due to the inherent limitations of voice recognition  software

## 2018-07-11 NOTE — Patient Instructions (Addendum)
Please follow up if symptoms do not improve or as needed.    Benign Positional Vertigo Vertigo is the feeling that you or your surroundings are moving when they are not. Benign positional vertigo is the most common form of vertigo. The cause of this condition is not serious (is benign). This condition is triggered by certain movements and positions (is positional). This condition can be dangerous if it occurs while you are doing something that could endanger you or others, such as driving. What are the causes? In many cases, the cause of this condition is not known. It may be caused by a disturbance in an area of the inner ear that helps your brain to sense movement and balance. This disturbance can be caused by a viral infection (labyrinthitis), head injury, or repetitive motion. What increases the risk? This condition is more likely to develop in:  Women.  People who are 50 years of age or older.  What are the signs or symptoms? Symptoms of this condition usually happen when you move your head or your eyes in different directions. Symptoms may start suddenly, and they usually last for less than a minute. Symptoms may include:  Loss of balance and falling.  Feeling like you are spinning or moving.  Feeling like your surroundings are spinning or moving.  Nausea and vomiting.  Blurred vision.  Dizziness.  Involuntary eye movement (nystagmus).  Symptoms can be mild and cause only slight annoyance, or they can be severe and interfere with daily life. Episodes of benign positional vertigo may return (recur) over time, and they may be triggered by certain movements. Symptoms may improve over time. How is this diagnosed? This condition is usually diagnosed by medical history and a physical exam of the head, neck, and ears. You may be referred to a health care provider who specializes in ear, nose, and throat (ENT) problems (otolaryngologist) or a provider who specializes in disorders of  the nervous system (neurologist). You may have additional testing, including:  MRI.  A CT scan.  Eye movement tests. Your health care provider may ask you to change positions quickly while he or she watches you for symptoms of benign positional vertigo, such as nystagmus. Eye movement may be tested with an electronystagmogram (ENG), caloric stimulation, the Dix-Hallpike test, or the roll test.  An electroencephalogram (EEG). This records electrical activity in your brain.  Hearing tests.  How is this treated? Usually, your health care provider will treat this by moving your head in specific positions to adjust your inner ear back to normal. Surgery may be needed in severe cases, but this is rare. In some cases, benign positional vertigo may resolve on its own in 2-4 weeks. Follow these instructions at home: Safety  Move slowly.Avoid sudden body or head movements.  Avoid driving.  Avoid operating heavy machinery.  Avoid doing any tasks that would be dangerous to you or others if a vertigo episode would occur.  If you have trouble walking or keeping your balance, try using a cane for stability. If you feel dizzy or unstable, sit down right away.  Return to your normal activities as told by your health care provider. Ask your health care provider what activities are safe for you. General instructions  Take over-the-counter and prescription medicines only as told by your health care provider.  Avoid certain positions or movements as told by your health care provider.  Drink enough fluid to keep your urine clear or pale yellow.  Keep all follow-up visits   as told by your health care provider. This is important. Contact a health care provider if:  You have a fever.  Your condition gets worse or you develop new symptoms.  Your family or friends notice any behavioral changes.  Your nausea or vomiting gets worse.  You have numbness or a "pins and needles" sensation. Get help  right away if:  You have difficulty speaking or moving.  You are always dizzy.  You faint.  You develop severe headaches.  You have weakness in your legs or arms.  You have changes in your hearing or vision.  You develop a stiff neck.  You develop sensitivity to light. This information is not intended to replace advice given to you by your health care provider. Make sure you discuss any questions you have with your health care provider. Document Released: 07/17/2006 Document Revised: 03/16/2016 Document Reviewed: 02/01/2015 Elsevier Interactive Patient Education  2018 [employer]vier Inc[employer]  

## 2018-07-18 ENCOUNTER — Other Ambulatory Visit: Payer: Self-pay | Admitting: Family Medicine

## 2018-07-26 DIAGNOSIS — M722 Plantar fascial fibromatosis: Secondary | ICD-10-CM | POA: Diagnosis not present

## 2018-07-26 DIAGNOSIS — M7662 Achilles tendinitis, left leg: Secondary | ICD-10-CM | POA: Diagnosis not present

## 2018-07-26 DIAGNOSIS — M7661 Achilles tendinitis, right leg: Secondary | ICD-10-CM | POA: Diagnosis not present

## 2018-08-08 ENCOUNTER — Encounter: Payer: Self-pay | Admitting: Family Medicine

## 2018-08-08 ENCOUNTER — Ambulatory Visit (INDEPENDENT_AMBULATORY_CARE_PROVIDER_SITE_OTHER): Payer: BLUE CROSS/BLUE SHIELD | Admitting: Family Medicine

## 2018-08-08 VITALS — BP 118/76 | HR 84 | Temp 98.2°F | Resp 16 | Ht 66.5 in | Wt 198.0 lb

## 2018-08-08 DIAGNOSIS — H8110 Benign paroxysmal vertigo, unspecified ear: Secondary | ICD-10-CM

## 2018-08-08 DIAGNOSIS — R0683 Snoring: Secondary | ICD-10-CM | POA: Diagnosis not present

## 2018-08-08 DIAGNOSIS — M7661 Achilles tendinitis, right leg: Secondary | ICD-10-CM | POA: Diagnosis not present

## 2018-08-08 DIAGNOSIS — R5383 Other fatigue: Secondary | ICD-10-CM

## 2018-08-08 DIAGNOSIS — M722 Plantar fascial fibromatosis: Secondary | ICD-10-CM | POA: Diagnosis not present

## 2018-08-08 DIAGNOSIS — M7662 Achilles tendinitis, left leg: Secondary | ICD-10-CM | POA: Diagnosis not present

## 2018-08-08 MED ORDER — MECLIZINE HCL 25 MG PO TABS: 25.0000 mg | ORAL_TABLET | Freq: Three times a day (TID) | ORAL | 0 refills | Status: DC | PRN

## 2018-08-08 NOTE — Patient Instructions (Signed)
Please return as scheduled. We will call you with information regarding your referral appointment. sleep Studies and Neuro Rehab for vertigo.  If you do not hear from [place] within the next 2 weeks, please let me know. It can take 1-2 weeks to get appointments set up with the specialists.   If you have any questions or concerns, please don't hesitate to send me a message via MyChart or call the office at [phone]. Thank you for visiting with [place] today! It's our pleasure caring for you.

## 2018-08-08 NOTE — Progress Notes (Signed)
Subjective  CC:  Chief Complaint  Patient presents with  . Dizziness    HPI: Ralph Phillips is a 57 y.o. male who presents to the office today to address the problems listed above in the chief complaint.  dxd with bppv last month: Returns due to persistent dizziness with movement.  Was taking meclizine, ran out.  Did well for 3 to 4 days.  However has traveled to and from recent [place].  He returned last night.  He has recurrent symptoms of dizziness with head.  He denies diplopia, dysarthria, paresis.  No significant headaches.  However he has been snoring and remains tired, wife says he stops breathing at night.  He has daytime fatigue.  Has never been evaluated for sleep apnea. Assessment  1. Benign paroxysmal positional vertigo, unspecified laterality   2. Snoring   3. Fatigue, unspecified type      Plan   BPPV: Restart meclizine but use as needed.  Refer for vestibular rehab.  Education given.  Discussed red flag symptoms.  Snoring and fatigue: Refer for sleep studies.  Follow up: Has appointment for CPE in November  Orders Placed This Encounter  Procedures  . Ambulatory referral to Pulmonology  . Ambulatory Referral to Neuro Rehab   Meds ordered this encounter  Medications  . meclizine (ANTIVERT) 25 MG tablet    Sig: Take 1 tablet (25 mg total) by mouth 3 (three) times daily as needed for dizziness.    Dispense:  30 tablet    Refill:  0      I reviewed the patients updated PMH, FH, and SocHx.    Patient Active Problem List   Diagnosis Date Noted  . Pain in joint of right ankle 06/25/2018  . Pain in joint of right shoulder 06/25/2018  . Carpal tunnel syndrome of left wrist 06/18/2018  . Gastroesophageal reflux disease without esophagitis 04/03/2018  . Esophageal dysphagia 04/03/2018  . Mixed hyperlipidemia 04/03/2018  . Carpal tunnel syndrome of right wrist 01/09/2018  . Diverticulosis 12/11/2017  . Obesity 09/20/2017  . Cervical radiculopathy  04/16/2017  . GAD (generalized anxiety disorder) 01/01/2017  . Insomnia 01/01/2017  . Chronic allergic rhinitis 01/01/2017  . Hypogonadism male 09/09/2013  . Prediabetes 09/09/2013   Current Meds  Medication Sig  . Azelastine HCl (ASTEPRO) 0.15 % SOLN Place 1 spray into the nose daily.  . fluticasone (FLONASE) 50 MCG/ACT nasal spray Place 2 sprays into both nostrils daily.   . Glucosamine-Chondroitin (GLUCOSAMINE CHONDR COMPLEX PO) Take by mouth daily.   Marland Kitchen levocetirizine (XYZAL) 5 MG tablet Take 1 tablet (5 mg total) by mouth every evening.  . Multiple Vitamin (MULTIVITAMIN WITH MINERALS) TABS tablet Take 1 tablet by mouth daily. Miracle 2000  . omeprazole (PRILOSEC) 40 MG capsule Take 1 capsule (40 mg total) by mouth daily.  Marland Kitchen OVER THE COUNTER MEDICATION Take 1 capsule by mouth 2 (two) times daily. Prosvent dietary supplement - Vitamin D3, Zinc, Black Pepper, Nettle Root, Sterol Esters, Pumpkin Seed, Saw Palmetto, Pygeum Africanum and Lycopene  . OVER THE COUNTER MEDICATION Place 1 drop into both eyes daily as needed (dry eyes). Rohto eye drops  . PARoxetine (PAXIL) 20 MG tablet TAKE 1 TABLET BY MOUTH EVERY DAY  . simvastatin (ZOCOR) 20 MG tablet TAKE 1 TABLET BY MOUTH EVERYDAY AT BEDTIME    Allergies: Patient has No Known Allergies. Family History: Patient family history includes Alzheimer's disease in his father; Arthritis in his maternal grandmother and mother; Colon cancer in his maternal uncle;  Colon polyps (age of onset: 5) in his father; Heart disease in his paternal grandmother; Hyperlipidemia in his father; Parkinson's disease in his mother; Stroke in his maternal grandfather. Social History:  Patient  reports that he quit smoking about 16 years ago. He has never used smokeless tobacco. He reports that he drinks alcohol. He reports that he does not use drugs.  Review of Systems: Constitutional: Negative for fever malaise or anorexia Cardiovascular: negative for chest  pain Respiratory: negative for SOB or persistent cough Gastrointestinal: negative for abdominal pain  Objective  Vitals: BP 118/76   Pulse 84   Temp 98.2 F (36.8 C)   Resp 16   Ht 5' 6.5" (1.689 m)   Wt 198 lb (89.8 kg)   BMI 31.48 kg/m  General: no acute distress , A&Ox3 HEENT: PEERL, conjunctiva normal, Oropharynx moist,neck is supple, large neck Cardiovascular:  RRR without murmur or gallop.  Respiratory:  Good breath sounds bilaterally, CTAB with normal respiratory effort Neuro, nonfocal, normal gait     Commons side effects, risks, benefits, and alternatives for medications and treatment plan prescribed today were discussed, and the patient expressed understanding of the given instructions. Patient is instructed to call or message via MyChart if he/she has any questions or concerns regarding our treatment plan. No barriers to understanding were identified. We discussed Red Flag symptoms and signs in detail. Patient expressed understanding regarding what to do in case of urgent or emergency type symptoms.   Medication list was reconciled, printed and provided to the patient in AVS. Patient instructions and summary information was reviewed with the patient as documented in the AVS. This note was prepared with assistance of Dragon voice recognition software. Occasional wrong-word or sound-a-like substitutions may have occurred due to the inherent limitations of voice recognition software

## 2018-08-08 NOTE — Progress Notes (Signed)
Pre visit review using our clinic review tool, if applicable. No additional management support is needed unless otherwise documented below in the visit note. 

## 2018-08-13 ENCOUNTER — Other Ambulatory Visit: Payer: Self-pay | Admitting: Family Medicine

## 2018-08-13 MED ORDER — SIMVASTATIN 20 MG PO TABS: ORAL_TABLET | ORAL | 0 refills | Status: DC

## 2018-08-13 NOTE — Telephone Encounter (Signed)
Copied from [place] [id number]. Topic: Quick Communication - Rx Refill/Question >> Aug 13, 2018  2:33 PM Bea Graff, NT wrote: Medication: simvastatin (ZOCOR) 20 MG tablet   Has the patient contacted their pharmacy? Yes.   (Agent: If no, request that the patient contact the pharmacy for the refill.) (Agent: If yes, when and what did the pharmacy advise?)  Preferred Pharmacy (with phone number or street name): [id number] - [place], [place] - [address] [phone] (Phone) [phone] (Fax)    Agent: Please be advised that RX refills may take up to 3 business days. We ask that you follow-up with your pharmacy.

## 2018-08-13 NOTE — Telephone Encounter (Signed)
Requested Prescriptions  Pending Prescriptions Disp Refills  . simvastatin (ZOCOR) 20 MG tablet 90 tablet 0    Sig: TAKE 1 TABLET BY MOUTH EVERYDAY AT BEDTIME     Cardiovascular:  Antilipid - Statins Failed - 08/13/2018  2:53 PM      Failed - LDL in normal range and within 360 days    LDL Cholesterol  Date Value Ref Range Status  04/03/2018 115 (H) 0 - 99 mg/dL Final         Failed - Triglycerides in normal range and within 360 days    Triglycerides  Date Value Ref Range Status  04/03/2018 162.0 (H) 0.0 - 149.0 mg/dL Final    Comment:    Normal:  <150 mg/dLBorderline High:  150 - 199 mg/dL         Passed - Total Cholesterol in normal range and within 360 days    Cholesterol  Date Value Ref Range Status  04/03/2018 191 0 - 200 mg/dL Final    Comment:    ATP III Classification       Desirable:  < 200 mg/dL               Borderline High:  200 - 239 mg/dL          High:  > = 240 mg/dL         Passed - HDL in normal range and within 360 days    HDL  Date Value Ref Range Status  04/03/2018 43.50 >39.00 mg/dL Final         Passed - Patient is not pregnant      Passed - Valid encounter within last 12 months    Recent Outpatient Visits          5 days ago Benign paroxysmal positional vertigo, unspecified laterality   [facility], [place]   1 month ago Benign paroxysmal positional vertigo, unspecified laterality   [facility] [place], [place]   4 months ago GAD (generalized anxiety disorder)   [facility], [facility]   8 months ago Adjustment insomnia   [facility] [place] [facility], Karie Fetch, MD   10 months ago [facility], Nickola Major, DO      Future Appointments            In 2 weeks Leamon Arnt, MD [facility], [facility]

## 2018-08-15 DIAGNOSIS — M7661 Achilles tendinitis, right leg: Secondary | ICD-10-CM | POA: Diagnosis not present

## 2018-08-15 DIAGNOSIS — M7662 Achilles tendinitis, left leg: Secondary | ICD-10-CM | POA: Diagnosis not present

## 2018-08-15 DIAGNOSIS — M722 Plantar fascial fibromatosis: Secondary | ICD-10-CM | POA: Diagnosis not present

## 2018-08-20 ENCOUNTER — Telehealth: Payer: Self-pay | Admitting: *Deleted

## 2018-08-20 MED ORDER — MECLIZINE HCL 25 MG PO TABS: 25.0000 mg | ORAL_TABLET | Freq: Three times a day (TID) | ORAL | 0 refills | Status: DC | PRN

## 2018-08-20 NOTE — Telephone Encounter (Signed)
Okay to refill? 

## 2018-08-20 NOTE — Telephone Encounter (Signed)
Patient is requesting that Dr. Jonni Sanger give the okay for a refill on his meclizine until he can get into the Neuro Rehab.  Routing to Dr. Jonni Sanger for approval

## 2018-08-20 NOTE — Telephone Encounter (Signed)
Medication has been sent to pharmacy.  °

## 2018-08-23 ENCOUNTER — Telehealth: Payer: Self-pay | Admitting: Internal Medicine

## 2018-08-23 NOTE — Telephone Encounter (Signed)
error 

## 2018-08-26 ENCOUNTER — Ambulatory Visit: Payer: BLUE CROSS/BLUE SHIELD | Attending: Family Medicine | Admitting: Physical Therapy

## 2018-08-26 ENCOUNTER — Encounter: Payer: Self-pay | Admitting: Physical Therapy

## 2018-08-26 ENCOUNTER — Other Ambulatory Visit: Payer: Self-pay

## 2018-08-26 VITALS — BP 147/93 | HR 78

## 2018-08-26 DIAGNOSIS — H8111 Benign paroxysmal vertigo, right ear: Secondary | ICD-10-CM | POA: Insufficient documentation

## 2018-08-26 DIAGNOSIS — R42 Dizziness and giddiness: Secondary | ICD-10-CM | POA: Diagnosis not present
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
## 2018-08-29 DIAGNOSIS — M722 Plantar fascial fibromatosis: Secondary | ICD-10-CM | POA: Diagnosis not present

## 2018-08-29 DIAGNOSIS — M7662 Achilles tendinitis, left leg: Secondary | ICD-10-CM | POA: Diagnosis not present

## 2018-08-29 DIAGNOSIS — M7661 Achilles tendinitis, right leg: Secondary | ICD-10-CM | POA: Diagnosis not present

## 2018-08-30 ENCOUNTER — Encounter: Payer: Self-pay | Admitting: Family Medicine

## 2018-08-30 ENCOUNTER — Ambulatory Visit (INDEPENDENT_AMBULATORY_CARE_PROVIDER_SITE_OTHER): Payer: BLUE CROSS/BLUE SHIELD | Admitting: Family Medicine

## 2018-08-30 ENCOUNTER — Other Ambulatory Visit: Payer: Self-pay

## 2018-08-30 VITALS — BP 118/80 | HR 73 | Temp 98.1°F | Ht 66.5 in | Wt 197.6 lb

## 2018-08-30 DIAGNOSIS — F411 Generalized anxiety disorder: Secondary | ICD-10-CM | POA: Diagnosis not present

## 2018-08-30 DIAGNOSIS — R7303 Prediabetes: Secondary | ICD-10-CM

## 2018-08-30 DIAGNOSIS — Z13220 Encounter for screening for lipoid disorders: Secondary | ICD-10-CM | POA: Insufficient documentation

## 2018-08-30 DIAGNOSIS — Z0000 Encounter for general adult medical examination without abnormal findings: Secondary | ICD-10-CM

## 2018-08-30 DIAGNOSIS — E782 Mixed hyperlipidemia: Secondary | ICD-10-CM | POA: Diagnosis not present

## 2018-08-30 LAB — POCT GLYCOSYLATED HEMOGLOBIN (HGB A1C): HEMOGLOBIN A1C: 5.7 % — AB (ref 4.0–5.6)

## 2018-08-30 LAB — LIPID PANEL
CHOL/HDL RATIO: 4
Cholesterol: 208 mg/dL — ABNORMAL HIGH (ref 0–200)
HDL: 48.2 mg/dL (ref >39.00)
NonHDL: 159.98
TRIGLYCERIDES: 241 mg/dL — AB (ref 0.0–149.0)
VLDL: 48.2 mg/dL — AB (ref 0.0–40.0)

## 2018-08-30 LAB — CBC WITH DIFFERENTIAL/PLATELET
BASOS ABS: 0 10*3/uL (ref 0.0–0.1)
Basophils Relative: 0.7 % (ref 0.0–3.0)
EOS ABS: 0.3 10*3/uL (ref 0.0–0.7)
Eosinophils Relative: 4.5 % (ref 0.0–5.0)
HCT: 43.3 % (ref 39.0–52.0)
Hemoglobin: 14.7 g/dL (ref 13.0–17.0)
LYMPHS ABS: 2 10*3/uL (ref 0.7–4.0)
Lymphocytes Relative: 35.4 % (ref 12.0–46.0)
MCHC: 33.9 g/dL (ref 30.0–36.0)
MCV: 91.3 fl (ref 78.0–100.0)
MONOS PCT: 9.3 % (ref 3.0–12.0)
Monocytes Absolute: 0.5 10*3/uL (ref 0.1–1.0)
Neutro Abs: 2.8 10*3/uL (ref 1.4–7.7)
Neutrophils Relative %: 50.1 % (ref 43.0–77.0)
Platelets: 155 10*3/uL (ref 150.0–400.0)
RBC: 4.74 Mil/uL (ref 4.22–5.81)
RDW: 13.8 % (ref 11.5–15.5)
WBC: 5.7 10*3/uL (ref 4.0–10.5)

## 2018-08-30 LAB — COMPREHENSIVE METABOLIC PANEL
ALT: 19 U/L (ref 0–53)
AST: 17 U/L (ref 0–37)
Albumin: 4.3 g/dL (ref 3.5–5.2)
Alkaline Phosphatase: 65 U/L (ref 39–117)
BILIRUBIN TOTAL: 0.4 mg/dL (ref 0.2–1.2)
BUN: 21 mg/dL (ref 6–23)
CO2: 27 mEq/L (ref 19–32)
CREATININE: 0.89 mg/dL (ref 0.40–1.50)
Calcium: 9.1 mg/dL (ref 8.4–10.5)
Chloride: 103 mEq/L (ref 96–112)
GFR: 94.2 mL/min (ref >60.00)
Glucose, Bld: 96 mg/dL (ref 70–99)
Potassium: 3.9 mEq/L (ref 3.5–5.1)
SODIUM: 138 meq/L (ref 135–145)
TOTAL PROTEIN: 7 g/dL (ref 6.0–8.3)

## 2018-08-30 LAB — PSA: PSA: 1.36 ng/mL (ref 0.10–4.00)

## 2018-08-30 LAB — LDL CHOLESTEROL, DIRECT: Direct LDL: 134 mg/dL

## 2018-08-30 NOTE — Progress Notes (Signed)
Subjective  Chief Complaint  Patient presents with  . Annual Exam    doing well, no complaints, HM up to date    HPI: Ralph Phillips is a 57 y.o. male who presents to [place] at [facility] today for a Male Wellness Visit. He also has the concerns and/or needs as listed above in the chief complaint. These will be addressed in addition to the Health Maintenance Visit.   Wellness Visit: annual visit with health maintenance review and exam    Doing well. GERD and GAD are fine. Tolerating statin for HLD. Seeing ortho for PF. No concerns.   HM: utd  Lifestyle: Body mass index is 31.42 kg/m. Wt Readings from Last 3 Encounters:  08/30/18 197 lb 9.6 oz (89.6 kg)  08/08/18 198 lb (89.8 kg)  07/11/18 195 lb (88.5 kg)   Diet: general Exercise: intermittently,     Patient Active Problem List   Diagnosis Date Noted  . Mixed hyperlipidemia 04/03/2018    Priority: High  . Cervical radiculopathy 04/16/2017    Priority: High  . GAD (generalized anxiety disorder) 01/01/2017    Priority: High  . Gastroesophageal reflux disease without esophagitis 04/03/2018    Priority: Medium  . Esophageal dysphagia 04/03/2018    Priority: Medium  . Diverticulosis 12/11/2017    Priority: Medium  . Obesity 09/20/2017    Priority: Medium  . Insomnia 01/01/2017    Priority: Medium  . Hypogonadism male 09/09/2013    Priority: Medium  . Prediabetes 09/09/2013    Priority: Medium  . Carpal tunnel syndrome of left wrist 06/18/2018    Priority: Low  . Carpal tunnel syndrome of right wrist 01/09/2018    Priority: Low  . Chronic allergic rhinitis 01/01/2017    Priority: Low  . Annual physical exam 08/30/2018   Health Maintenance  Topic Date Due  . Samul Dada  01/29/2024  . COLONOSCOPY  06/24/2024  . Hepatitis C Screening  Completed  . HIV Screening  Addressed  . INFLUENZA VACCINE  Discontinued   Immunization History  Administered Date(s) Administered  .  Influenza Split 08/16/2012  . Influenza,inj,Quad PF,6+ Mos 07/27/2015, 07/27/2016  . Tdap 01/28/2014   We updated and reviewed the patient's past history in detail and it is documented below. Allergies: Patient has No Known Allergies. Past Medical History  has a past medical history of Allergy, Blood transfusion without reported diagnosis (1994), Carpal tunnel syndrome, Diverticulosis (12/11/2017), GAD (generalized anxiety disorder), GERD (gastroesophageal reflux disease), Heart murmur, Hyperlipidemia, Lower urinary tract symptoms (LUTS), Medial meniscus tear (09/11/2012), Obesity, Pre-diabetes, and Seizures ([place]). Past Surgical History Patient  has a past surgical history that includes Appendectomy (1974); Wrist surgery (1994); buttock surgery (1986); Shoulder surgery (1991); Facial reconstruction surgery (1968); Biceps tendon repair (2018); and Carpal tunnel release (Right). Social History Patient  reports that he quit smoking about 16 years ago. He has never used smokeless tobacco. He reports that he drinks alcohol. He reports that he does not use drugs. Family History family history includes Alzheimer's disease in his father; Arthritis in his maternal grandmother and mother; Colon cancer in his maternal uncle; Colon polyps (age of onset: 73) in his father; Heart disease in his paternal grandmother; Hyperlipidemia in his father; Parkinson's disease in his mother; Stroke in his maternal grandfather. Review of Systems: Constitutional: negative for fever or malaise Ophthalmic: negative for photophobia, double vision or loss of vision Cardiovascular: negative for chest pain, dyspnea on exertion, or new LE swelling Respiratory: negative for SOB or persistent cough  Gastrointestinal: negative for abdominal pain, change in bowel habits or melena Genitourinary: negative for dysuria or gross hematuria Musculoskeletal: negative for new gait disturbance or muscular weakness Integumentary: negative for  new or persistent rashes Neurological: negative for TIA or stroke symptoms Psychiatric: negative for SI or delusions Allergic/Immunologic: negative for hives  Patient Care Team    Relationship Specialty Notifications Start End  Leamon Arnt, MD PCP - General Family Medicine  12/11/17   Roseanne Kaufman, MD Consulting Physician Orthopedic Surgery  12/11/17   Suella Broad, MD Consulting Physician Physical Medicine and Rehabilitation  12/11/17   Ardis Hughs, MD Attending Physician Urology  12/11/17   Irene Shipper, MD Consulting Physician Gastroenterology  12/11/17    Objective  Vitals: BP 118/80   Pulse 73   Temp 98.1 F (36.7 C)   Ht 5' 6.5" (1.689 m)   Wt 197 lb 9.6 oz (89.6 kg)   SpO2 98%   BMI 31.42 kg/m  General:  Well developed, well nourished, no acute distress  Psych:  Alert and orientedx3,normal mood and affect HEENT:  Normocephalic, atraumatic, non-icteric sclera, PERRL, oropharynx is clear without mass or exudate, supple neck without adenopathy, mass or thyromegaly Cardiovascular:  Normal S1, S2, RRR without gallop, rub or murmur, nondisplaced PMI, +2 distal pulses in bilateral upper and lower extremities. Respiratory:  Good breath sounds bilaterally, CTAB with normal respiratory effort Gastrointestinal: normal bowel sounds, soft, non-tender, no noted masses. No HSM MSK: no deformities, contusions. Joints are without erythema or swelling. Spine and CVA region are nontender Skin:  Warm, no rashes or suspicious lesions noted Neurologic:    Mental status is normal. CN 2-11 are normal. Gross motor and sensory exams are normal. Stable gait. No tremor GU: No inguinal hernias or adenopathy are appreciated bilaterally   Assessment  1. Annual physical exam   2. Mixed hyperlipidemia   3. Prediabetes   4. GAD (generalized anxiety disorder)      Plan  Male Wellness Visit:  Age appropriate Health Maintenance and Prevention measures were discussed with patient. Included  topics are cancer screening recommendations, ways to keep healthy (see AVS) including dietary and exercise recommendations, regular eye and dental care, use of seat belts, and avoidance of moderate alcohol use and tobacco use.   BMI: discussed patient's BMI and encouraged positive lifestyle modifications to help get to or maintain a target BMI.  HM needs and immunizations were addressed and ordered. See below for orders. See HM and immunization section for updates.  Routine labs and screening tests ordered including cmp, cbc and lipids where appropriate.  Discussed recommendations regarding Vit D and calcium supplementation (see AVS)  Prediabetes, very mild and stable   Continue ppi and SSRI - well controlled   Lab Results  Component Value Date   HGBA1C 5.7 (A) 08/30/2018   HGBA1C 5.7 (A) 04/03/2018   HGBA1C 6.3 08/21/2017      Follow up: Return in about 1 year (around 08/31/2019) for complete physical.   Commons side effects, risks, benefits, and alternatives for medications and treatment plan prescribed today were discussed, and the patient expressed understanding of the given instructions. Patient is instructed to call or message via MyChart if he/she has any questions or concerns regarding our treatment plan. No barriers to understanding were identified. We discussed Red Flag symptoms and signs in detail. Patient expressed understanding regarding what to do in case of urgent or emergency type symptoms.   Medication list was reconciled, printed and provided to the patient  in AVS. Patient instructions and summary information was reviewed with the patient as documented in the AVS. This note was prepared with assistance of Dragon voice recognition software. Occasional wrong-word or sound-a-like substitutions may have occurred due to the inherent limitations of voice recognition software  Orders Placed This Encounter  Procedures  . CBC with Differential/Platelet  . Comprehensive  metabolic panel  . PSA  . Lipid panel  . POCT glycosylated hemoglobin (Hb A1C)   No orders of the defined types were placed in this encounter.

## 2018-08-30 NOTE — Patient Instructions (Addendum)
Please return in 12 months for your annual complete physical; please come fasting.   If you have any questions or concerns, please don't hesitate to send me a message via MyChart or call the office at [phone]. Thank you for visiting with [place] today! It's our pleasure caring for you.  Please do these things to maintain good health!   Exercise at least 30-45 minutes a day,  4-5 days a week.   Eat a low-fat diet with lots of fruits and vegetables, up to 7-9 servings per day.  Drink plenty of water daily. Try to drink 8 8oz glasses per day.  Seatbelts can save your life. Always wear your seatbelt.  Place Smoke Detectors on every level of your home and check batteries every year.  Eye Doctor - have an eye exam every 1-2 years  Safe sex - use condoms to protect yourself from STDs if you could be exposed to these types of infections.  Avoid heavy alcohol use. If you drink, keep it to less than 2 drinks/day and not every day.  [place].  Choose someone you trust that could speak for you if you became unable to speak for yourself.  Depression is common in our stressful world.If you're feeling down or losing interest in things you normally enjoy, please come in for a visit.

## 2018-09-05 ENCOUNTER — Ambulatory Visit: Payer: BLUE CROSS/BLUE SHIELD | Admitting: Physical Therapy

## 2018-09-05 ENCOUNTER — Telehealth: Payer: Self-pay | Admitting: Physical Therapy

## 2018-09-05 NOTE — Telephone Encounter (Signed)
Contacted pt regarding missed visit, no show today.  Pt reports he just returned from travel to [place], [place] and became sick with respiratory symptoms yesterday.  Alerted pt to his next appointment and advised pt to call and cancel if he continued to feel ill.  Pt verbalized understanding.  [name], PT, DPT 09/05/18    8:39 AM

## 2018-09-06 ENCOUNTER — Ambulatory Visit (INDEPENDENT_AMBULATORY_CARE_PROVIDER_SITE_OTHER): Payer: BLUE CROSS/BLUE SHIELD | Admitting: Pulmonary Disease

## 2018-09-06 ENCOUNTER — Encounter: Payer: Self-pay | Admitting: Pulmonary Disease

## 2018-09-06 VITALS — BP 114/72 | HR 71 | Ht 66.0 in | Wt 203.0 lb

## 2018-09-06 DIAGNOSIS — R0683 Snoring: Secondary | ICD-10-CM

## 2018-09-06 DIAGNOSIS — R0681 Apnea, not elsewhere classified: Secondary | ICD-10-CM | POA: Diagnosis not present

## 2018-09-06 NOTE — Progress Notes (Signed)
Subjective:    Patient ID: Ralph Phillips, male    DOB: 1961-06-27, 57 y.o.   MRN: [id number]  Chief complaint-snoring, witnessed apneas  Patient with a history of witnessed apneas, significant snoring Chronic insomnia that has he has had for many years Has used multiple agents including Lunesta, trazodone, Ambien with no significant benefit Currently uses zolpidem at night which seems to help Usually goes to bed about 12-2 aM, Usually gets up in the morning about 7 30-8 Wakes up about 2 or 3 times during the night to use the bathroom With no morning headaches Admits night sweats  Travels a lot and this affects his sleep schedule  He feels relatively well otherwise  History of panic attacks at night Multiple concussions in the past He could not associate is chronic insomnia with any particular event     Review of Systems  Constitutional: Negative for fever and unexpected weight change.  HENT: Negative for congestion, dental problem, ear pain, nosebleeds, postnasal drip, rhinorrhea, sinus pressure, sneezing, sore throat and trouble swallowing.   Eyes: Negative for redness and itching.  Respiratory: Positive for cough, shortness of breath and wheezing. Negative for chest tightness.   Cardiovascular: Negative for palpitations and leg swelling.  Gastrointestinal: Negative for nausea and vomiting.  Genitourinary: Negative for dysuria.  Musculoskeletal: Negative for joint swelling.  Skin: Negative for rash.  Neurological: Negative for headaches.  Hematological: Does not bruise/bleed easily.  Psychiatric/Behavioral: Negative for dysphoric mood. The patient is not nervous/anxious.    Past Medical History:  Diagnosis Date  . Allergy    Pollen, dust  . Blood transfusion without reported diagnosis 1994   during surgery  . Carpal tunnel syndrome   . Diverticulosis 12/11/2017   By colonoscopy, Dr. Henrene Pastor, 2015  . GAD (generalized anxiety disorder)   . GERD  (gastroesophageal reflux disease)   . Heart murmur    childhood  . Hyperlipidemia   . Lower urinary tract symptoms (LUTS)   . Medial meniscus tear 09/11/2012  . Obesity   . Pre-diabetes   . Seizures ([place])    childhood   Social History   Socioeconomic History  . Marital status: Married    Spouse name: Not on file  . Number of children: 3  . Years of education: Not on file  . Highest education level: Not on file  Occupational History  . Not on file  Social Needs  . Financial resource strain: Not on file  . Food insecurity:    Worry: Not on file    Inability: Not on file  . Transportation needs:    Medical: Not on file    Non-medical: Not on file  Tobacco Use  . Smoking status: Former Smoker    Packs/day: 1.00    Years: 5.00    Pack years: 5.00    Types: Cigarettes    Last attempt to quit: 10/23/2001    Years since quitting: 16.8  . Smokeless tobacco: Never Used  Substance and Sexual Activity  . Alcohol use: Yes    Comment: 2 beers a month  . Drug use: No  . Sexual activity: Not on file  Lifestyle  . Physical activity:    Days per week: Not on file    Minutes per session: Not on file  . Stress: Not on file  Relationships  . Social connections:    Talks on phone: Not on file    Gets together: Not on file    Attends religious service: Not on file  Active member of club or organization: Not on file    Attends meetings of clubs or organizations: Not on file    Relationship status: Not on file  . Intimate partner violence:    Fear of current or ex partner: Not on file    Emotionally abused: Not on file    Physically abused: Not on file    Forced sexual activity: Not on file  Other Topics Concern  . 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 Arthritis Mother   . Parkinson's disease Mother   . Hyperlipidemia Father   . Alzheimer's disease Father   . Colon polyps Father 1  . Arthritis Maternal Grandmother   . Stroke Maternal Grandfather   . Heart disease Paternal Grandmother   . Colon cancer Maternal Uncle   . Esophageal cancer Neg Hx   . Stomach cancer Neg Hx   . Rectal cancer Neg Hx    Vitals:   09/06/18 1003  BP: 114/72  Pulse: 71  SpO2: 95%    Epworth Sleepiness Scale of 3    Objective:   Physical Exam  Constitutional: He is oriented to person, place, and time. He appears well-nourished.  HENT:  Head: Normocephalic and atraumatic.  Mouth/Throat: Oropharynx is clear and moist. No oropharyngeal exudate.  Class IV Mallampati  Eyes: EOM are normal. Right eye exhibits no discharge. Left eye exhibits no discharge.  Neck: Normal range of motion. Neck supple. No tracheal deviation present. No thyromegaly present.  Pulmonary/Chest: Effort normal and breath sounds normal. No stridor. No respiratory distress.  Abdominal: Soft. Bowel sounds are normal. He exhibits no distension. There is no tenderness. There is no rebound.  Musculoskeletal: He exhibits no edema or deformity.  Neurological: He is alert and oriented to person, place, and time.  Skin: Skin is warm and dry. No rash noted. He is not diaphoretic. No erythema.  Psychiatric: He has a normal mood and affect.      Assessment & Plan:  .  High probability of significant sleep disordered breathing  .  Chronic insomnia for which he uses zolpidem at the present time-works  .  Inadequate sleep may be contributing to some daytime fatigue and sleepiness  Plan .  We will schedule him for a home sleep study .  Treatment options including CPAP therapy, oral device, surgical interventions discussed .  Pathophysiology of sleep disordered breathing discussed with the patient .   Continue zolpidem for the treatment of insomnia .  Importance of an adequate number of hours of sleep 6 to 8 hours discussed .  Importance of exercise and weight loss discussed .  I will see patient back in the office in about 3 months  Encouraged to call with any significant concerns

## 2018-09-06 NOTE — Patient Instructions (Signed)
Chronic insomnia High probability of obstructive sleep apnea  We will order home sleep study, treatment option will be an auto titrating CPAP  We will see you back in the office in about 3 months  Call with any significant concerns  Continue with zolpidem use for insomnia Continue with weight loss efforts

## 2018-09-11 ENCOUNTER — Ambulatory Visit: Payer: BLUE CROSS/BLUE SHIELD | Admitting: Physical Therapy

## 2018-09-11 ENCOUNTER — Encounter: Payer: Self-pay | Admitting: Physical Therapy

## 2018-09-11 DIAGNOSIS — R42 Dizziness and giddiness: Secondary | ICD-10-CM

## 2018-09-11 DIAGNOSIS — H8111 Benign paroxysmal vertigo, right ear: Secondary | ICD-10-CM | POA: Diagnosis not present
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
## 2018-09-11 NOTE — Patient Instructions (Addendum)
How to Perform the Epley Maneuver The Epley maneuver is an exercise that relieves symptoms of vertigo. Vertigo is the feeling that you or your surroundings are moving when they are not. When you feel vertigo, you may feel like the room is spinning and have trouble walking. Dizziness is a little different than vertigo. When you are dizzy, you may feel unsteady or light-headed. You can do this maneuver at home whenever you have symptoms of vertigo. You can do it up to 3 times a day until your symptoms go away. Even though the Epley maneuver may relieve your vertigo for a few weeks, it is possible that your symptoms will return. This maneuver relieves vertigo, but it does not relieve dizziness. What are the risks? If it is done correctly, the Epley maneuver is considered safe. Sometimes it can lead to dizziness or nausea that goes away after a short time. If you develop other symptoms, such as changes in vision, weakness, or numbness, stop doing the maneuver and call your health care provider. How to perform the Epley maneuver 1. Sit on the edge of a bed or table with your back straight and your legs extended or hanging over the edge of the bed or table. 2. Turn your head halfway toward the affected ear or side. 3. Lie backward quickly with your head turned until you are lying flat on your back. You may want to position a pillow under your shoulders. 4. Hold this position for 30 seconds. You may experience an attack of vertigo. This is normal. 5. Turn your head to the opposite direction until your unaffected ear is facing the floor. 6. Hold this position for 30 seconds. You may experience an attack of vertigo. This is normal. Hold this position until the vertigo stops. 7. Turn your whole body to the same side as your head. Hold for another 30 seconds. 8. Sit back up. You can repeat this exercise up to 3 times a day. Follow these instructions at home:  After doing the Epley maneuver, you can return to  your normal activities.  Ask your health care provider if there is anything you should do at home to prevent vertigo. He or she may recommend that you: ? Keep your head raised (elevated) with two or more pillows while you sleep. ? Do not sleep on the side of your affected ear. ? Get up slowly from bed. ? Avoid sudden movements during the day. ? Avoid extreme head movement, like looking up or bending over. Contact a health care provider if:  Your vertigo gets worse.  You have other symptoms, including: ? Nausea. ? Vomiting. ? Headache. Get help right away if:  You have vision changes.  You have a severe or worsening headache or neck pain.  You cannot stop vomiting.  You have new numbness or weakness in any part of your body. Summary  Vertigo is the feeling that you or your surroundings are moving when they are not.  The Epley maneuver is an exercise that relieves symptoms of vertigo.  If the Epley maneuver is done correctly, it is considered safe. You can do it up to 3 times a day. This information is not intended to replace advice given to you by your health care provider. Make sure you discuss any questions you have with your health care provider. Document Released: 10/14/2013 Document Revised: 08/29/2016 Document Reviewed: 08/29/2016 Elsevier Interactive Patient Education  2017 [employer].  Gaze Stabilization - Tip Card  1.Target must remain in focus, not blurry, and appear stationary while head is in motion. 2.Perform exercises with small head movements (45 to either side of midline). 3.Increase speed of head motion so long as target is in focus. 4.If you wear eyeglasses, be sure you can see target through lens (therapist will give specific instructions for bifocal / progressive lenses). 5.These exercises may provoke dizziness or nausea. Work through these symptoms. If too dizzy, slow head movement slightly. Rest between each  exercise. 6.Exercises demand concentration; avoid distractions. 7.For safety, perform standing exercises close to a counter, wall, corner, or next to someone.  Copyright  VHI. All rights reserved.   Gaze Stabilization - Standing Feet Apart   Feet shoulder width apart, keeping eyes on target on wall 4-5 feet away, tilt head down slightly and move head side to side for 60 seconds. Repeat while moving head up and down for 60 seconds. *Work up to tolerating 120 seconds, as able. Do 2 sets.   Copyright  VHI. All rights reserved.

## 2018-09-17 ENCOUNTER — Ambulatory Visit (INDEPENDENT_AMBULATORY_CARE_PROVIDER_SITE_OTHER): Payer: BLUE CROSS/BLUE SHIELD | Admitting: Internal Medicine

## 2018-09-17 ENCOUNTER — Encounter: Payer: Self-pay | Admitting: Internal Medicine

## 2018-09-17 VITALS — BP 120/70 | HR 72 | Ht 66.5 in | Wt 199.0 lb

## 2018-09-17 DIAGNOSIS — K222 Esophageal obstruction: Secondary | ICD-10-CM | POA: Diagnosis not present

## 2018-09-17 DIAGNOSIS — R1310 Dysphagia, unspecified: Secondary | ICD-10-CM | POA: Diagnosis not present

## 2018-09-17 DIAGNOSIS — R935 Abnormal findings on diagnostic imaging of other abdominal regions, including retroperitoneum: Secondary | ICD-10-CM | POA: Diagnosis not present

## 2018-09-17 DIAGNOSIS — K219 Gastro-esophageal reflux disease without esophagitis: Secondary | ICD-10-CM | POA: Diagnosis not present

## 2018-09-17 MED ORDER — OMEPRAZOLE 40 MG PO CPDR: 40.0000 mg | DELAYED_RELEASE_CAPSULE | Freq: Every day | ORAL | 11 refills | Status: DC

## 2018-09-17 NOTE — Patient Instructions (Signed)
We have sent the following medications to your pharmacy for you to pick up at your convenience:  Omeprazole  
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
## 2018-09-18 ENCOUNTER — Encounter: Payer: Self-pay | Admitting: Physical Therapy

## 2018-09-18 ENCOUNTER — Ambulatory Visit: Payer: BLUE CROSS/BLUE SHIELD | Admitting: Physical Therapy

## 2018-09-18 NOTE — Therapy (Signed)
[facility] [address] [place], [place], [address] Phone: [phone]   Fax:  [phone]  Patient Details  Name: Ralph Phillips MRN: [id number] Date of Birth: 01-Oct-1961 Referring Provider:  No ref. provider found  Encounter Date: 09/18/2018  PHYSICAL THERAPY DISCHARGE SUMMARY  Visits from Start of Care: 2  Current functional level related to goals / functional outcomes: All goals met and vertigo resolved.  PT Long Term Goals - 09/18/18 1230      PT LONG TERM GOAL #1   Title  Pt will report no dizziness with bed mobilitiy, bending down to the floor, quick head and body turns    Time  4    Period  Weeks    Status  Achieved      PT LONG TERM GOAL #2   Title  Pt will demonstrate negative positional testing (no vertigo, no nystagmus)    Time  4    Period  Weeks    Status  Achieved      PT LONG TERM GOAL #3   Title  Pt will demonstrate independence with home Epley manuever to perform at home or when traveling out of town for work if BPPV reoccurs    Time  4    Period  Weeks    Status  Achieved         Remaining deficits: None   Education / Equipment: HEP  Plan: Patient agrees to discharge.  Patient goals were met. Patient is being discharged due to meeting the stated rehab goals.  ?????     [name], PT, DPT 09/18/18    12:31 PM   [facility] [address] [place], [place], [address] Phone: [phone]   Fax:  [phone]

## 2018-09-23 ENCOUNTER — Telehealth: Payer: Self-pay

## 2018-09-23 MED ORDER — ESOMEPRAZOLE MAGNESIUM 40 MG PO CPDR: DELAYED_RELEASE_CAPSULE | ORAL | 3 refills | Status: DC

## 2018-09-23 NOTE — Telephone Encounter (Signed)
Omeprazole not covered by patient's insurance.  Patient requested Esomeprazole which is on his formulary.  Rx for Esomeprazol sent.

## 2018-11-06 ENCOUNTER — Other Ambulatory Visit: Payer: Self-pay | Admitting: Family Medicine

## 2018-11-25 DIAGNOSIS — M84363A Stress fracture, right fibula, initial encounter for fracture: Secondary | ICD-10-CM | POA: Diagnosis not present

## 2018-11-25 DIAGNOSIS — M7751 Other enthesopathy of right foot: Secondary | ICD-10-CM | POA: Diagnosis not present

## 2018-11-25 DIAGNOSIS — M67371 Transient synovitis, right ankle and foot: Secondary | ICD-10-CM | POA: Diagnosis not present

## 2018-11-25 DIAGNOSIS — S92001A Unspecified fracture of right calcaneus, initial encounter for closed fracture: Secondary | ICD-10-CM | POA: Diagnosis not present

## 2018-11-25 DIAGNOSIS — M19071 Primary osteoarthritis, right ankle and foot: Secondary | ICD-10-CM | POA: Diagnosis not present

## 2018-11-25 DIAGNOSIS — S93491A Sprain of other ligament of right ankle, initial encounter: Secondary | ICD-10-CM | POA: Diagnosis not present

## 2018-11-29 ENCOUNTER — Other Ambulatory Visit: Payer: Self-pay | Admitting: Family Medicine

## 2018-12-02 DIAGNOSIS — M84363D Stress fracture, right fibula, subsequent encounter for fracture with routine healing: Secondary | ICD-10-CM | POA: Diagnosis not present

## 2018-12-02 DIAGNOSIS — S92001D Unspecified fracture of right calcaneus, subsequent encounter for fracture with routine healing: Secondary | ICD-10-CM | POA: Diagnosis not present

## 2018-12-02 DIAGNOSIS — S93491D Sprain of other ligament of right ankle, subsequent encounter: Secondary | ICD-10-CM | POA: Diagnosis not present

## 2018-12-02 DIAGNOSIS — M67371 Transient synovitis, right ankle and foot: Secondary | ICD-10-CM | POA: Diagnosis not present

## 2018-12-11 ENCOUNTER — Other Ambulatory Visit: Payer: Self-pay | Admitting: Family Medicine

## 2018-12-11 NOTE — Telephone Encounter (Signed)
Last refill:06/14/18 #30, 5 Last OV:08/30/18

## 2018-12-16 DIAGNOSIS — M84363D Stress fracture, right fibula, subsequent encounter for fracture with routine healing: Secondary | ICD-10-CM | POA: Diagnosis not present

## 2018-12-16 DIAGNOSIS — S92001D Unspecified fracture of right calcaneus, subsequent encounter for fracture with routine healing: Secondary | ICD-10-CM | POA: Diagnosis not present

## 2018-12-16 DIAGNOSIS — S93491D Sprain of other ligament of right ankle, subsequent encounter: Secondary | ICD-10-CM | POA: Diagnosis not present

## 2018-12-16 DIAGNOSIS — M67371 Transient synovitis, right ankle and foot: Secondary | ICD-10-CM | POA: Diagnosis not present

## 2019-01-01 DIAGNOSIS — M84363D Stress fracture, right fibula, subsequent encounter for fracture with routine healing: Secondary | ICD-10-CM | POA: Diagnosis not present

## 2019-01-01 DIAGNOSIS — S92001D Unspecified fracture of right calcaneus, subsequent encounter for fracture with routine healing: Secondary | ICD-10-CM | POA: Diagnosis not present

## 2019-01-08 DIAGNOSIS — S92001D Unspecified fracture of right calcaneus, subsequent encounter for fracture with routine healing: Secondary | ICD-10-CM | POA: Diagnosis not present

## 2019-01-08 DIAGNOSIS — S93491D Sprain of other ligament of right ankle, subsequent encounter: Secondary | ICD-10-CM | POA: Diagnosis not present

## 2019-01-08 DIAGNOSIS — M84363D Stress fracture, right fibula, subsequent encounter for fracture with routine healing: Secondary | ICD-10-CM | POA: Diagnosis not present

## 2019-01-16 ENCOUNTER — Other Ambulatory Visit: Payer: Self-pay

## 2019-01-16 ENCOUNTER — Encounter: Payer: Self-pay | Admitting: Family Medicine

## 2019-01-16 ENCOUNTER — Ambulatory Visit (INDEPENDENT_AMBULATORY_CARE_PROVIDER_SITE_OTHER): Payer: BLUE CROSS/BLUE SHIELD | Admitting: Family Medicine

## 2019-01-16 ENCOUNTER — Ambulatory Visit: Payer: Self-pay | Admitting: *Deleted

## 2019-01-16 DIAGNOSIS — B9689 Other specified bacterial agents as the cause of diseases classified elsewhere: Secondary | ICD-10-CM

## 2019-01-16 DIAGNOSIS — J988 Other specified respiratory disorders: Secondary | ICD-10-CM | POA: Diagnosis not present

## 2019-01-16 DIAGNOSIS — J208 Acute bronchitis due to other specified organisms: Secondary | ICD-10-CM

## 2019-01-16 MED ORDER — BENZONATATE 100 MG PO CAPS: 100.0000 mg | ORAL_CAPSULE | Freq: Two times a day (BID) | ORAL | 0 refills | Status: DC | PRN

## 2019-01-16 MED ORDER — DOXYCYCLINE HYCLATE 100 MG PO TABS: 100.0000 mg | ORAL_TABLET | Freq: Two times a day (BID) | ORAL | 0 refills | Status: AC

## 2019-01-16 NOTE — Telephone Encounter (Signed)
Please advise 

## 2019-01-16 NOTE — Progress Notes (Signed)
Virtual Visit via Video Note  Subjective  CC:  Chief Complaint  Patient presents with  . URI    Symptoms started 01/12/19, he reports cough, congestion, heavy sweating, and mucous. He denies fever and SOB. He has tried taking Naproxen and Xyzal as well as Flonase.Marland Kitchen He did travel over 1 month ago to [place]    HPI:  I connected with Ralph Phillips on 01/16/19 at  3:45 PM EDT by a video enabled telemedicine application and verified that I am speaking with the correct person using two identifiers. Location patient: Home Location provider: [employer], Office Persons participating in the virtual visit: Ralph Phillips, Leamon Arnt, MD   I discussed the limitations of evaluation and management by telemedicine and the availability of in person appointments. The patient expressed understanding and agreed to proceed. 58 y.o. male with 4 days of cough, sweats, and congestion with PND. Cough is productive of yellow green sputum. He denies sob, fever and has been checking regularly (see triage note), chest pain with breathing, DOE, rash or GI sxs. His wife started having similar sxs yesterday. He was concerned due to h/o travel to [place]: that was about 3-4 weeks ago though. No h/o copd or asthma. Former smoker. He did not have the flu shot this season.   Assessment  1. Acute bacterial bronchitis   2. Respiratory infection      Plan   Respiratory infection w/o fever:  Diff dx includes coronavirus, nonspecific viral respiratory infection, bacterial bronchitis or other. He appears stable clinically by video virtual visit observations. Will empirically treat for bacterial bronchitis given chills/sweats and productive cough w/o myalgias but discussed the very real possibility of having the novel virus. He does not meet current recommendations for testing in our community; recommend self isolation, supportive care, doxy and tessalon perles and monitoring  for sob or worsening sxs.  I discussed the assessment and treatment plan with the patient. The patient was provided an opportunity to ask questions and all were answered. The patient agreed with the plan and demonstrated an understanding of the instructions.   The patient was advised to call back or seek an in-person evaluation if the symptoms worsen or if the condition fails to improve as anticipated. Follow up: prn    Meds ordered this encounter  Medications  . doxycycline (VIBRA-TABS) 100 MG tablet    Sig: Take 1 tablet (100 mg total) by mouth 2 (two) times daily for 7 days.    Dispense:  14 tablet    Refill:  0  . benzonatate (TESSALON) 100 MG capsule    Sig: Take 1 capsule (100 mg total) by mouth 2 (two) times daily as needed for cough.    Dispense:  20 capsule    Refill:  0      I reviewed the patients updated PMH, FH, and SocHx.    Patient Active Problem List   Diagnosis Date Noted  . Mixed hyperlipidemia 04/03/2018    Priority: High  . Cervical radiculopathy 04/16/2017    Priority: High  . GAD (generalized anxiety disorder) 01/01/2017    Priority: High  . Gastroesophageal reflux disease without esophagitis 04/03/2018    Priority: Medium  . Esophageal dysphagia 04/03/2018    Priority: Medium  . Diverticulosis 12/11/2017    Priority: Medium  . Obesity 09/20/2017    Priority: Medium  . Insomnia 01/01/2017    Priority: Medium  . Hypogonadism male 09/09/2013    Priority: Medium  .  Prediabetes 09/09/2013    Priority: Medium  . Carpal tunnel syndrome of left wrist 06/18/2018    Priority: Low  . Carpal tunnel syndrome of right wrist 01/09/2018    Priority: Low  . Chronic allergic rhinitis 01/01/2017    Priority: Low  . Annual physical exam 08/30/2018   Current Meds  Medication Sig  . aspirin EC 81 MG tablet Take 81 mg by mouth daily.  Marland Kitchen esomeprazole (NEXIUM) 40 MG capsule Take one capsule daily  . fluticasone (FLONASE) 50 MCG/ACT nasal spray Place 2 sprays into  both nostrils daily.   . Glucosamine-Chondroitin (GLUCOSAMINE CHONDR COMPLEX PO) Take by mouth daily.   Marland Kitchen levocetirizine (XYZAL) 5 MG tablet TAKE 1 TABLET BY MOUTH EVERY DAY IN THE EVENING  . Multiple Vitamin (MULTIVITAMIN WITH MINERALS) TABS tablet Take 1 tablet by mouth daily. Miracle 2000  . OVER THE COUNTER MEDICATION Take 1 capsule by mouth 2 (two) times daily. Prosvent dietary supplement - Vitamin D3, Zinc, Black Pepper, Nettle Root, Sterol Esters, Pumpkin Seed, Saw Palmetto, Pygeum Africanum and Lycopene  . OVER THE COUNTER MEDICATION Place 1 drop into both eyes daily as needed (dry eyes). Rohto eye drops  . PARoxetine (PAXIL) 20 MG tablet TAKE 1 TABLET BY MOUTH EVERY DAY  . simvastatin (ZOCOR) 20 MG tablet TAKE 1 TABLET BY MOUTH EVERYDAY AT BEDTIME  . zolpidem (AMBIEN) 10 MG tablet TAKE 1 TABLET (10 MG TOTAL) BY MOUTH AT BEDTIME AS NEEDED FOR SLEEP.    Allergies: Patient has No Known Allergies. Family History: Patient family history includes Alzheimer's disease in his father; Arthritis in his maternal grandmother and mother; Colon cancer in his maternal uncle; Colon polyps (age of onset: 71) in his father; Heart disease in his paternal grandmother; Hyperlipidemia in his father; Parkinson's disease in his mother; Stroke in his maternal grandfather. Social History:  Patient  reports that he quit smoking about 17 years ago. His smoking use included cigarettes. He has a 5.00 pack-year smoking history. He has never used smokeless tobacco. He reports current alcohol use. He reports that he does not use drugs.  @OBJECTIVE /OBSERVATIONS@ Vitals: There were no vitals taken for this visit. 97.4, pt checked with ear thermometer during visit General: no acute distress , A&Ox3 HEENT: nasal congestion present with some coughing Respirations are normal and non labored. No retractions. Speaking in clear full sentences.   I provided 25 minutes of non-face-to-face time during this encounter. Leamon Arnt, MD

## 2019-01-16 NOTE — Patient Instructions (Signed)
Please follow up if symptoms do not improve or as needed.  It is very hard to know if this is due to the new coronavirus vs another infection. Your symptoms are mild. We will try antibiotics and cough medications to see if that will help.   Monitor your breathing and call if you have problems with breathing. Your symptoms should start to improve in the next 3-5 days although it may take longer to resolve.   Due to the pandemic, I recommend self isolation at this time until 3 days after your symptoms are improving.   Acute Bronchitis, Adult  Acute bronchitis is sudden (acute) swelling of the air tubes (bronchi) in the lungs. Acute bronchitis causes these tubes to fill with mucus, which can make it hard to breathe. It can also cause coughing or wheezing. In adults, acute bronchitis usually goes away within 2 weeks. A cough caused by bronchitis may last up to 3 weeks. Smoking, allergies, and asthma can make the condition worse. Repeated episodes of bronchitis may cause further lung problems, such as chronic obstructive pulmonary disease (COPD). What are the causes? This condition can be caused by germs and by substances that irritate the lungs, including:  Cold and flu viruses. This condition is most often caused by the same virus that causes a cold.  Bacteria.  Exposure to tobacco smoke, dust, fumes, and air pollution. What increases the risk? This condition is more likely to develop in people who:  Have close contact with someone with acute bronchitis.  Are exposed to lung irritants, such as tobacco smoke, dust, fumes, and vapors.  Have a weak immune system.  Have a respiratory condition such as asthma. What are the signs or symptoms? Symptoms of this condition include:  A cough.  Coughing up clear, yellow, or green mucus.  Wheezing.  Chest congestion.  Shortness of breath.  A fever.  Body aches.  Chills.  A sore throat. How is this diagnosed? This condition is  usually diagnosed with a physical exam. During the exam, your health care provider may order tests, such as chest X-rays, to rule out other conditions. He or she may also:  Test a sample of your mucus for bacterial infection.  Check the level of oxygen in your blood. This is done to check for pneumonia.  Do a chest X-ray or lung function testing to rule out pneumonia and other conditions.  Perform blood tests. Your health care provider will also ask about your symptoms and medical history. How is this treated? Most cases of acute bronchitis clear up over time without treatment. Your health care provider may recommend:  Drinking more fluids. Drinking more makes your mucus thinner, which may make it easier to breathe.  Taking a medicine for a fever or cough.  Taking an antibiotic medicine.  Using an inhaler to help improve shortness of breath and to control a cough.  Using a cool mist vaporizer or humidifier to make it easier to breathe. Follow these instructions at home: Medicines  Take over-the-counter and prescription medicines only as told by your health care provider.  If you were prescribed an antibiotic, take it as told by your health care provider. Do not stop taking the antibiotic even if you start to feel better. General instructions   Get plenty of rest.  Drink enough fluids to keep your urine pale yellow.  Avoid smoking and secondhand smoke. Exposure to cigarette smoke or irritating chemicals will make bronchitis worse. If you smoke and you need help  quitting, ask your health care provider. Quitting smoking will help your lungs heal faster.  Use an inhaler, cool mist vaporizer, or humidifier as told by your health care provider.  Keep all follow-up visits as told by your health care provider. This is important. How is this prevented? To lower your risk of getting this condition again:  Wash your hands often with soap and water. If soap and water are not available,  use hand sanitizer.  Avoid contact with people who have cold symptoms.  Try not to touch your hands to your mouth, nose, or eyes.  Make sure to get the flu shot every year. Contact a health care provider if:  Your symptoms do not improve in 2 weeks of treatment. Get help right away if:  You cough up blood.  You have chest pain.  You have severe shortness of breath.  You become dehydrated.  You faint or keep feeling like you are going to faint.  You keep vomiting.  You have a severe headache.  Your fever or chills gets worse. This information is not intended to replace advice given to you by your health care provider. Make sure you discuss any questions you have with your health care provider. Document Released: 11/16/2004 Document Revised: 05/23/2017 Document Reviewed: 03/29/2016 Elsevier Interactive Patient Education  2019 [employer].

## 2019-01-16 NOTE — Telephone Encounter (Signed)
Happy to have virtual video visit. Please schedule.

## 2019-01-16 NOTE — Telephone Encounter (Signed)
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 96.7 on 3/25, 96.2 on 3/24, and 96.3 on 3/23; these were obtained with a forehead thermometer; pt answers "no" to questions on [facility] corona virus evaluation; triage initiated and recommendations made per protocol; the pt agrees to a virtual visit; he normally sees Dr Billey Chang, [facility]; he can be contacted at [phone], and message can be left; his email address is: [email or web address]; he normally sees Dr Jonni Sanger, [facility]; will route to office for final disposition; notified Joaquim Lai and Levada Dy.   Reason for Disposition . [1] Cough occurs AND [2] within 14 days of COVID-19 EXPOSURE  Answer Assessment - Initial Assessment Questions 1. CONFIRMED CASE: "Who is the person with the confirmed COVID-19 infection that you were exposed to?"     Pt is not sure; was in airport in [place] 2. PLACE of CONTACT: "Where were you when you were exposed to COVID-19  (coronavirus disease 2019)?" (e.g., city, state, country)    Pt was in [place], but has been to the grocery store in [place] 3. TYPE of CONTACT: "How much contact was there?" (e.g., live in same house, work in same office, same school)     Same space 4. DATE of CONTACT: "When did you have contact with a coronavirus patient?" (e.g., days)     Days 5. DURATION of CONTACT: "How long were you in contact with the COVID-19 (coronavirus disease) patient?" (e.g., a few seconds, passed by person, a few minutes,  live with the patient)     Passed by people 6. SYMPTOMS: "Do you have any symptoms?" (e.g., fever, cough, breathing difficulty)     Productive cough with Green secretions, night sweats sleeping a lot 7. PREGNANCY OR POSTPARTUM: "Is there any chance you are pregnant?" "When was your last menstrual period?" "Did you deliver in the last 2 weeks?"     n/a 8. HIGH RISK: "Do you have any heart or lung problems? Do you have a weakened immune system?" (e.g., CHF, COPD, asthma, HIV positive, chemotherapy, renal failure, diabetes mellitus, sickle cell anemia)     no  Protocols used: CORONAVIRUS (COVID-19) EXPOSURE-A-AH

## 2019-01-16 NOTE — Telephone Encounter (Signed)
Pt has been called and scheduled.

## 2019-01-16 NOTE — Progress Notes (Signed)
I have discussed the procedure for the virtual visit with the patient who has given consent to proceed with assessment and treatment.   Tiara S Simmons, CMA     

## 2019-01-28 ENCOUNTER — Ambulatory Visit: Payer: Self-pay | Admitting: *Deleted

## 2019-01-28 NOTE — Telephone Encounter (Signed)
Patient calling with complaints of cough after virtual visit on 01/16/19. Pt states he has completed the antibiotics last week and has been taking the tessalon pearls with no improvement in cough and states that he is still having nose dripping. Pt stated that he still has a productive cough that has green mucus and states the coughing makes it difficult for him to sleep at night. Pt denies any fever, blood in sputum or shortness of breath at this time. Pt states he has been using throat lozenges which does give him some relief. Pt also has comoplaints of headaches in the forehead area and sore throat.Pt advised that information will be forwarded to PCP to review of recommendations and pt will receive a return call tomorrow when the office opens. Pt and pt's wife advised that if he has any worsening symptoms during the night that he should seek treatment in the ED/urgent care or that he could also do an evisit. Pt verbalized understanding. Pt can be contacted at [phone] and email address verified in chart.  Reason for Disposition . [1] Recent medical visit within 24 hours AND [2] condition/symptoms SAME (unchanged) AND [3] caller has additional questions triager can answer  Answer Assessment - Initial Assessment Questions 1. MAIN CONCERN OR SYMPTOM:  "What is your main concern right now?" "What question do you have?" "What's the main symptom you're worried about?" (e.g., breathing difficulty, cough, fever. pain)     cough 2. ONSET: "When did the  cough  start?"     Since visit on 01/16/19 3. BETTER-SAME-WORSE: "Are you getting better, staying the same, or getting worse compared to how you felt at your last visit to the doctor (most recent medical visit)"?     Feels the same 4. VISIT DATE: "When were you seen?" (Date)     01/16/19 5. VISIT DOCTOR: "What is the name of the doctor taking care of you now?"     Dr. Jonni Sanger 6. VISIT DIAGNOSIS:  "What was the main symptom or problem that you were seen for?"  "Were you given a diagnosis?"      bronchitis 7. VISIT MEDICATIONS: "Did the physician order any new medicines for you to use?" If yes: "Have you filled the prescription and started taking the medicine?"     Abx and tessalon pearls and prescriptions of abx completed last week 8. NEXT APPOINTMENT: "Have you scheduled a follow-up appointment with your doctor?"     no 9. PAIN: "Is there any pain?" If so, ask: "How bad is it?"  (Scale 1-10; or mild, moderate, severe)     Throat pain  10. FEVER: "Do you have a fever?" If so, ask: "What is it, how was it measured  and when did it start?"       No 11. OTHER SYMPTOMS: "Do you have any other symptoms?"       Headaches in forehead, throat pain and runny nose  Protocols used: RECENT MEDICAL VISIT FOR ILLNESS FOLLOW-UP CALL-A-AH

## 2019-01-29 MED ORDER — GUAIFENESIN-CODEINE 100-10 MG/5ML PO SOLN: 5.0000 mL | Freq: Four times a day (QID) | ORAL | 0 refills | Status: DC | PRN

## 2019-01-29 MED ORDER — FLUTICASONE PROPIONATE 50 MCG/ACT NA SUSP: 1.0000 | Freq: Every day | NASAL | 5 refills | Status: DC

## 2019-01-29 NOTE — Telephone Encounter (Signed)
Pt is aware, verbalized understanding, and asked if he can also get an RX for flonase which he is currently using and is helping. He also states that he and his wife found out that her nail tech which she recently saw prior to husband's symptoms had been to [place] and contracted [place].

## 2019-01-29 NOTE — Telephone Encounter (Signed)
Please advise 

## 2019-01-29 NOTE — Telephone Encounter (Signed)
sxs could be due to covid-19. If no SOB, will treat with cough medications. Remain in home with self isolation recommendations. Please call pt to ensure no breathing problems. Then let me know and I will order cough medications. Recommend f/u virtual visit next week Monday or Tuesday for recheck.

## 2019-01-29 NOTE — Telephone Encounter (Signed)
Suspected covid: no respiratory compromise. Will f/u early next week for recheck.  Has been given instructions for emergency sxs. rec self-isolation.  Ordered robitussin AC and flonase.

## 2019-02-04 ENCOUNTER — Other Ambulatory Visit: Payer: Self-pay | Admitting: Family Medicine

## 2019-02-05 ENCOUNTER — Ambulatory Visit (INDEPENDENT_AMBULATORY_CARE_PROVIDER_SITE_OTHER): Payer: BLUE CROSS/BLUE SHIELD | Admitting: Family Medicine

## 2019-02-05 ENCOUNTER — Encounter: Payer: Self-pay | Admitting: Family Medicine

## 2019-02-05 ENCOUNTER — Other Ambulatory Visit: Payer: Self-pay

## 2019-02-05 VITALS — Temp 97.4°F

## 2019-02-05 DIAGNOSIS — J988 Other specified respiratory disorders: Secondary | ICD-10-CM

## 2019-02-05 DIAGNOSIS — Z20828 Contact with and (suspected) exposure to other viral communicable diseases: Secondary | ICD-10-CM | POA: Diagnosis not present

## 2019-02-05 DIAGNOSIS — Z20822 Contact with and (suspected) exposure to covid-19: Secondary | ICD-10-CM

## 2019-02-05 DIAGNOSIS — R6889 Other general symptoms and signs: Principal | ICD-10-CM

## 2019-02-05 NOTE — Patient Instructions (Signed)
Please return in November 2020 for your annual complete physical; please come fasting.  If you have any questions or concerns, please don't hesitate to send me a message via MyChart or call the office at [phone]. Thank you for visiting with [place] today! It's our pleasure caring for you.

## 2019-02-05 NOTE — Progress Notes (Signed)
Virtual Visit via Video Note  Subjective  CC:  Chief Complaint  Patient presents with  . Follow-up  . Bronchitis    He reports that he is feeling better and cough is much better    HPI:  I connected with Ralph Phillips on 02/05/19 at  1:20 PM EDT by a video enabled telemedicine application and verified that I am speaking with the correct person using two identifiers. Location patient: Home Location provider: [employer], Office Persons participating in the virtual visit: Rocklin Soderquist, Leamon Arnt, MD Lilli Light, [place] discussed the limitations of evaluation and management by telemedicine and the availability of in person appointments. The patient expressed understanding and agreed to proceed. . Patient is a 58 y.o. @GENDER @ who presents via video conferencing with the following concerns: o F/u for suspected covid -19 infection. Pt reports he is improving. See last notes. No fevers, cough is less, no longer with body aches. Energy remains lower than normal but able to exercise in his home gym daily. No chest pain. No DOE or SOB at rest. No GI sxs. Wife is slowly improving as well. Remains at home in self - isolation.  o Had been using a home made cough syrup with asa 325 x 3!, peaches and lime boiled together.  Assessment  1. Suspected 2019 Novel Coronavirus Infection   2. Respiratory infection      Plan   Suspected covid-19:  Improving slowly. Continue supportive care. May use home made cough syrup but with only 1 aspirin recommended. Call if worsens.  I discussed the assessment and treatment plan with the patient. The patient was provided an opportunity to ask questions and all were answered. The patient agreed with the plan and demonstrated an understanding of the instructions.   The patient was advised to call back or seek an in-person evaluation if the symptoms worsen or if the condition fails to improve as anticipated. Follow up:  Return in about 7 months (around 09/07/2019) for complete physical.  Visit date not found  No orders of the defined types were placed in this encounter.     I reviewed the patients updated PMH, FH, and SocHx.    Patient Active Problem List   Diagnosis Date Noted  . Mixed hyperlipidemia 04/03/2018    Priority: High  . Cervical radiculopathy 04/16/2017    Priority: High  . GAD (generalized anxiety disorder) 01/01/2017    Priority: High  . Gastroesophageal reflux disease without esophagitis 04/03/2018    Priority: Medium  . Esophageal dysphagia 04/03/2018    Priority: Medium  . Diverticulosis 12/11/2017    Priority: Medium  . Obesity 09/20/2017    Priority: Medium  . Insomnia 01/01/2017    Priority: Medium  . Hypogonadism male 09/09/2013    Priority: Medium  . Prediabetes 09/09/2013    Priority: Medium  . Carpal tunnel syndrome of left wrist 06/18/2018    Priority: Low  . Carpal tunnel syndrome of right wrist 01/09/2018    Priority: Low  . Chronic allergic rhinitis 01/01/2017    Priority: Low  . Annual physical exam 08/30/2018   Current Meds  Medication Sig  . aspirin EC 81 MG tablet Take 81 mg by mouth daily.  Marland Kitchen esomeprazole (NEXIUM) 40 MG capsule Take one capsule daily  . fluticasone (FLONASE) 50 MCG/ACT nasal spray Place 1 spray into both nostrils daily.  . Glucosamine-Chondroitin (GLUCOSAMINE CHONDR COMPLEX PO) Take by mouth daily.   Marland Kitchen levocetirizine (XYZAL) 5  MG tablet TAKE 1 TABLET BY MOUTH EVERY DAY IN THE EVENING  . Multiple Vitamin (MULTIVITAMIN WITH MINERALS) TABS tablet Take 1 tablet by mouth daily. Miracle 2000  . OVER THE COUNTER MEDICATION Take 1 capsule by mouth 2 (two) times daily. Prosvent dietary supplement - Vitamin D3, Zinc, Black Pepper, Nettle Root, Sterol Esters, Pumpkin Seed, Saw Palmetto, Pygeum Africanum and Lycopene  . OVER THE COUNTER MEDICATION Place 1 drop into both eyes daily as needed (dry eyes). Rohto eye drops  . PARoxetine (PAXIL) 20 MG  tablet TAKE 1 TABLET BY MOUTH EVERY DAY  . simvastatin (ZOCOR) 20 MG tablet TAKE 1 TABLET BY MOUTH EVERYDAY AT BEDTIME  . [DISCONTINUED] benzonatate (TESSALON) 100 MG capsule Take 1 capsule (100 mg total) by mouth 2 (two) times daily as needed for cough.  . [DISCONTINUED] guaiFENesin-codeine 100-10 MG/5ML syrup Take 5 mLs by mouth every 6 (six) hours as needed for cough.    Allergies: Patient has No Known Allergies. Family History: Patient family history includes Alzheimer's disease in his father; Arthritis in his maternal grandmother and mother; Colon cancer in his maternal uncle; Colon polyps (age of onset: 20) in his father; Heart disease in his paternal grandmother; Hyperlipidemia in his father; Parkinson's disease in his mother; Stroke in his maternal grandfather. Social History:  Patient  reports that he quit smoking about 17 years ago. His smoking use included cigarettes. He has a 5.00 pack-year smoking history. He has never used smokeless tobacco. He reports current alcohol use. He reports that he does not use drugs.  Review of Systems: Constitutional: Negative for fever malaise or anorexia Cardiovascular: negative for chest pain Respiratory: negative for SOB or persistent cough Gastrointestinal: negative for abdominal pain  OBJECTIVE Vitals: Temp (!) 97.4 F (36.3 C) (Oral)  General: no acute distress , A&Ox3 Appears well. No cough or respiratory distress  Leamon Arnt, MD

## 2019-03-31 DIAGNOSIS — Z20828 Contact with and (suspected) exposure to other viral communicable diseases: Secondary | ICD-10-CM | POA: Diagnosis not present

## 2019-05-01 ENCOUNTER — Other Ambulatory Visit: Payer: Self-pay | Admitting: *Deleted

## 2019-05-01 ENCOUNTER — Telehealth: Payer: Self-pay | Admitting: Family Medicine

## 2019-05-01 MED ORDER — PAROXETINE HCL 20 MG PO TABS: 20.0000 mg | ORAL_TABLET | Freq: Every day | ORAL | 1 refills | Status: DC

## 2019-05-01 NOTE — Telephone Encounter (Signed)
Called pt to verify which medication is needed. Paxil is needed and has been sent to [facility] [name], he is aware.

## 2019-05-01 NOTE — Telephone Encounter (Signed)
Please advise on refill   Copied from [place] [phone]. Topic: General - Other >> Apr 30, 2019  5:11 PM Keene Breath wrote: Reason for CRM: Patient call after hours regarding a refill for his medication.  Pharmacy told him to call the office.  CB# [phone]

## 2019-05-07 ENCOUNTER — Other Ambulatory Visit: Payer: Self-pay | Admitting: Family Medicine

## 2019-06-09 ENCOUNTER — Other Ambulatory Visit: Payer: Self-pay | Admitting: Family Medicine

## 2019-07-04 ENCOUNTER — Other Ambulatory Visit: Payer: Self-pay | Admitting: Family Medicine

## 2019-07-30 ENCOUNTER — Other Ambulatory Visit: Payer: Self-pay

## 2019-07-30 DIAGNOSIS — Z20828 Contact with and (suspected) exposure to other viral communicable diseases: Secondary | ICD-10-CM | POA: Diagnosis not present

## 2019-07-30 DIAGNOSIS — Z20822 Contact with and (suspected) exposure to covid-19: Secondary | ICD-10-CM

## 2019-07-31 ENCOUNTER — Other Ambulatory Visit: Payer: Self-pay | Admitting: Family Medicine

## 2019-07-31 LAB — NOVEL CORONAVIRUS, NAA: SARS-CoV-2, NAA: NOT DETECTED

## 2019-10-21 DIAGNOSIS — J029 Acute pharyngitis, unspecified: Secondary | ICD-10-CM | POA: Diagnosis not present

## 2019-10-27 ENCOUNTER — Other Ambulatory Visit: Payer: Self-pay | Admitting: Family Medicine

## 2019-10-28 ENCOUNTER — Other Ambulatory Visit: Payer: Self-pay | Admitting: Family Medicine

## 2019-10-28 NOTE — Telephone Encounter (Signed)
Please call to get schedule for annual exam. Last was 08/2018. Thank. Refilled meds for 90 days w/ 1 refill.

## 2019-10-28 NOTE — Telephone Encounter (Signed)
Scheduled him for 02/11/20 @ 8:20 am

## 2019-11-14 ENCOUNTER — Other Ambulatory Visit: Payer: Self-pay | Admitting: Family Medicine

## 2019-12-05 ENCOUNTER — Other Ambulatory Visit: Payer: Self-pay | Admitting: Family Medicine

## 2019-12-08 NOTE — Telephone Encounter (Signed)
LAST APPOINTMENT DATE: 02/05/2019  NEXT APPOINTMENT DATE:02/11/2020   LAST REFILL: 06/09/2019  QTY: 30 tablet

## 2019-12-26 ENCOUNTER — Other Ambulatory Visit: Payer: Self-pay | Admitting: Family Medicine

## 2020-01-13 DIAGNOSIS — K645 Perianal venous thrombosis: Secondary | ICD-10-CM | POA: Diagnosis not present

## 2020-01-13 DIAGNOSIS — K5909 Other constipation: Secondary | ICD-10-CM | POA: Diagnosis not present

## 2020-01-21 ENCOUNTER — Other Ambulatory Visit: Payer: Self-pay | Admitting: Family Medicine

## 2020-02-05 ENCOUNTER — Other Ambulatory Visit: Payer: Self-pay | Admitting: Family Medicine

## 2020-02-11 ENCOUNTER — Encounter: Payer: BC Managed Care – PPO | Admitting: Family Medicine

## 2020-02-19 ENCOUNTER — Encounter: Payer: Self-pay | Admitting: Family Medicine

## 2020-02-24 ENCOUNTER — Telehealth: Payer: Self-pay | Admitting: Family Medicine

## 2020-02-24 NOTE — Telephone Encounter (Signed)
Please waive $50 No show fee.

## 2020-02-26 NOTE — Telephone Encounter (Signed)
Look behind me on this.  I am not seeing a no show fee owed.

## 2020-03-30 DIAGNOSIS — M25561 Pain in right knee: Secondary | ICD-10-CM | POA: Diagnosis not present

## 2020-04-09 DIAGNOSIS — M25561 Pain in right knee: Secondary | ICD-10-CM | POA: Diagnosis not present

## 2020-04-14 ENCOUNTER — Other Ambulatory Visit: Payer: Self-pay | Admitting: Family Medicine

## 2020-04-14 ENCOUNTER — Other Ambulatory Visit: Payer: Self-pay

## 2020-04-21 DIAGNOSIS — M25561 Pain in right knee: Secondary | ICD-10-CM | POA: Diagnosis not present

## 2020-04-28 DIAGNOSIS — M25561 Pain in right knee: Secondary | ICD-10-CM | POA: Diagnosis not present

## 2020-05-03 ENCOUNTER — Other Ambulatory Visit: Payer: Self-pay | Admitting: Family Medicine

## 2020-05-24 DIAGNOSIS — Y9366 Activity, soccer: Secondary | ICD-10-CM | POA: Diagnosis not present

## 2020-05-24 DIAGNOSIS — X58XXXA Exposure to other specified factors, initial encounter: Secondary | ICD-10-CM | POA: Diagnosis not present

## 2020-05-24 DIAGNOSIS — M1711 Unilateral primary osteoarthritis, right knee: Secondary | ICD-10-CM | POA: Diagnosis not present

## 2020-05-24 DIAGNOSIS — S83231A Complex tear of medial meniscus, current injury, right knee, initial encounter: Secondary | ICD-10-CM | POA: Diagnosis not present

## 2020-05-24 HISTORY — PX: OTHER SURGICAL HISTORY: SHX169

## 2020-06-01 ENCOUNTER — Other Ambulatory Visit: Payer: Self-pay | Admitting: Family Medicine

## 2020-06-01 NOTE — Telephone Encounter (Signed)
LR: 12-08-2019 Qty: 30 w 5 refills  Last office visit: 02-05-2019 Upcoming appointment: 06-07-2020

## 2020-06-07 ENCOUNTER — Other Ambulatory Visit: Payer: Self-pay

## 2020-06-07 ENCOUNTER — Encounter: Payer: Self-pay | Admitting: Family Medicine

## 2020-06-07 ENCOUNTER — Ambulatory Visit (INDEPENDENT_AMBULATORY_CARE_PROVIDER_SITE_OTHER): Payer: BC Managed Care – PPO | Admitting: Family Medicine

## 2020-06-07 VITALS — BP 128/80 | HR 63 | Temp 98.2°F | Resp 18 | Ht 67.0 in | Wt 209.0 lb

## 2020-06-07 DIAGNOSIS — Z0000 Encounter for general adult medical examination without abnormal findings: Secondary | ICD-10-CM | POA: Diagnosis not present

## 2020-06-07 DIAGNOSIS — E782 Mixed hyperlipidemia: Secondary | ICD-10-CM | POA: Diagnosis not present

## 2020-06-07 DIAGNOSIS — M654 Radial styloid tenosynovitis [de Quervain]: Secondary | ICD-10-CM

## 2020-06-07 DIAGNOSIS — E6609 Other obesity due to excess calories: Secondary | ICD-10-CM | POA: Diagnosis not present

## 2020-06-07 DIAGNOSIS — F5101 Primary insomnia: Secondary | ICD-10-CM

## 2020-06-07 DIAGNOSIS — R7303 Prediabetes: Secondary | ICD-10-CM | POA: Diagnosis not present

## 2020-06-07 DIAGNOSIS — E66811 Obesity, class 1: Secondary | ICD-10-CM

## 2020-06-07 DIAGNOSIS — F411 Generalized anxiety disorder: Secondary | ICD-10-CM

## 2020-06-07 DIAGNOSIS — K219 Gastro-esophageal reflux disease without esophagitis: Secondary | ICD-10-CM | POA: Diagnosis not present

## 2020-06-07 MED ORDER — TRAZODONE HCL 50 MG PO TABS: 50.0000 mg | ORAL_TABLET | Freq: Every evening | ORAL | 3 refills | Status: DC | PRN

## 2020-06-07 MED ORDER — DICLOFENAC SODIUM 75 MG PO TBEC
75.0000 mg | DELAYED_RELEASE_TABLET | Freq: Two times a day (BID) | ORAL | 0 refills | Status: DC
Start: 2020-06-07 — End: 2020-06-21

## 2020-06-07 NOTE — Progress Notes (Signed)
Subjective  Chief Complaint  Patient presents with  . Annual Exam    Fasting labs. Recent surgery recovering well.   . Wrist Pain    x 2 months, pain radiates down to his lefft thumb. Concerns for a bone being out of place.   . Skin Tag    growing around his neck and his left eye lid.     HPI: Ralph Phillips is a 59 y.o. male who presents to [facility] [place] today for a Male Wellness Visit. He also has the concerns and/or needs as listed above in the chief complaint. These will be addressed in addition to the Health Maintenance Visit.   Wellness Visit: annual visit with health maintenance review and exam    Health maintenance: Fasting for complete physical.  He has gained weight since last visit.  Working from home mostly during the [employer] pandemic.  Continues to play soccer however had an injury and is s/p right meniscal repair.  Recovering well. Lifestyle: Body mass index is 32.73 kg/m. Wt Readings from Last 3 Encounters:  06/07/20 209 lb (94.8 kg)  09/17/18 199 lb (90.3 kg)  09/06/18 203 lb (92.1 kg)     Chronic disease management visit and/or acute problem visit:  Complains of left wrist pain x2 months.  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  No noted injury.  Pain with thumb movement.  No swelling or redness.  History of wrist surgery and scapholunate fracture in the past.  No weakness.  Hyperlipidemia on statin: Well-tolerated.  Due for recheck  Chronic GERD on PPI without breakthrough symptoms.  Has been evaluated by GI in the past  Chronic insomnia: Has been controlled on Ambien.  Now no longer working as well.  Requesting new options.  No significant stressors  General anxiety disorder is well controlled on Paxil.  No response.  No depressive symptoms  History of prediabetes.  Denies symptoms of hyperglycemia  Complains of mildly irritated skin tags on neck and left eyelid  Patient Active Problem List   Diagnosis  Date Noted  . Mixed hyperlipidemia 04/03/2018  . Cervical radiculopathy 04/16/2017  . GAD (generalized anxiety disorder) 01/01/2017  . Gastroesophageal reflux disease without esophagitis 04/03/2018  . Esophageal dysphagia 04/03/2018  . Diverticulosis 12/11/2017  . Obesity 09/20/2017  . Insomnia 01/01/2017  . Hypogonadism male 09/09/2013  . Prediabetes 09/09/2013  . Carpal tunnel syndrome of left wrist 06/18/2018  . Carpal tunnel syndrome of right wrist 01/09/2018  . Chronic allergic rhinitis 01/01/2017   Health Maintenance  Topic Date Due  . TETANUS/TDAP  01/29/2024  . COLONOSCOPY  06/24/2024  . COVID-19 Vaccine  Completed  . Hepatitis C Screening  Addressed  . HIV Screening  Addressed  . INFLUENZA VACCINE  Discontinued   Immunization History  Administered Date(s) Administered  . Influenza Split 08/16/2012  . Influenza,inj,Quad PF,6+ Mos 07/27/2015, 07/27/2016  . PFIZER SARS-COV-2 Vaccination 04/21/2020, 05/22/2020  . Tdap 01/28/2014   We updated and reviewed the patient's past history in detail and it is documented below. Allergies: Patient has No Known Allergies. Past Medical History  has a past medical history of Allergy, Blood transfusion without reported diagnosis (1994), Carpal tunnel syndrome, Diverticulosis (12/11/2017), GAD (generalized anxiety disorder), GERD (gastroesophageal reflux disease), Heart murmur, Hyperlipidemia, Lower urinary tract symptoms (LUTS), Medial meniscus tear (09/11/2012), Obesity, Pre-diabetes, and Seizures ([place]). Past Surgical History Patient  has a past surgical history that includes Appendectomy (1974); Wrist surgery (1994); buttock  surgery (1986); Shoulder surgery (1991); Facial reconstruction surgery (1968); Biceps tendon repair (2018); Carpal tunnel release (Right); and Torn Meniscus (Right, 05/24/2020). Social History Patient  reports that he quit smoking about 18 years ago. His smoking use included cigarettes. He has a 5.00 pack-year  smoking history. He has never used smokeless tobacco. He reports current alcohol use. He reports that he does not use drugs. Family History family history includes Alzheimer's disease in his father; Arthritis in his maternal grandmother and mother; Colon cancer in his maternal uncle; Colon polyps (age of onset: 46) in his father; Heart disease in his paternal grandmother; Hyperlipidemia in his father; Parkinson's disease in his mother; Stroke in his maternal grandfather. Review of Systems: Constitutional: negative for fever or malaise Ophthalmic: negative for photophobia, double vision or loss of vision Cardiovascular: negative for chest pain, dyspnea on exertion, or new LE swelling Respiratory: negative for SOB or persistent cough Gastrointestinal: negative for abdominal pain, change in bowel habits or melena Genitourinary: negative for dysuria or gross hematuria Musculoskeletal: negative for new gait disturbance or muscular weakness Integumentary: negative for new or persistent rashes Neurological: negative for TIA or stroke symptoms Psychiatric: negative for SI or delusions Allergic/Immunologic: negative for hives  Patient Care Team    Relationship Specialty Notifications Start End  Leamon Arnt, MD PCP - General Family Medicine  12/11/17   Roseanne Kaufman, MD Consulting Physician Orthopedic Surgery  12/11/17   Suella Broad, MD Consulting Physician Physical Medicine and Rehabilitation  12/11/17   Ardis Hughs, MD Attending Physician Urology  12/11/17   Irene Shipper, MD Consulting Physician Gastroenterology  12/11/17   Meredith Pel, MD Consulting Physician Orthopedic Surgery  06/07/20    Objective  Vitals: BP 128/80   Pulse 63   Temp 98.2 F (36.8 C) (Temporal)   Resp 18   Ht 5\' 7"  (1.702 m)   Wt 209 lb (94.8 kg)   SpO2 96%   BMI 32.73 kg/m  General:  Well developed, well nourished, no acute distress  Psych:  Alert and orientedx3,normal mood and affect HEENT:   Normocephalic, atraumatic, non-icteric sclera, PERRL, oropharynx is clear without mass or exudate, supple neck without adenopathy, mass or thyromegaly Cardiovascular:  Normal S1, S2, RRR without gallop, rub or murmur, nondisplaced PMI, +2 distal pulses in bilateral upper and lower extremities. Respiratory:  Good breath sounds bilaterally, CTAB with normal respiratory effort Gastrointestinal: normal bowel sounds, soft, non-tender, no noted masses. No HSM MSK: no deformities, contusions. Joints are without erythema or swelling. Spine and CVA region are nontender, left wrist with tender extensor tendon of the thumb, positive Finkelstein's, no warmth or erythema Skin:  Warm, no rashes or suspicious lesions noted, very small skin tags on neck and left eyelid, not irritated Neurologic:    Mental status is normal. CN 2-11 are normal. Gross motor and sensory exams are normal. Stable gait. No tremor GU: No inguinal hernias or adenopathy are appreciated bilaterally   Assessment  1. Annual physical exam   2. Mixed hyperlipidemia   3. Gastroesophageal reflux disease without esophagitis   4. Prediabetes   5. De Quervain's tenosynovitis, left   6. Primary insomnia   7. Class 1 obesity due to excess calories with serious comorbidity in adult, unspecified BMI   8. GAD (generalized anxiety disorder)      Plan  Male Wellness Visit:  Age appropriate Health Maintenance and Prevention measures were discussed with patient. Included topics are cancer screening recommendations, ways to keep healthy (see AVS)  including dietary and exercise recommendations, regular eye and dental care, use of seat belts, and avoidance of moderate alcohol use and tobacco use.   BMI: discussed patient's BMI and encouraged positive lifestyle modifications to help get to or maintain a target BMI.  HM needs and immunizations were addressed and ordered. See below for orders. See HM and immunization section for updates.  Routine labs  and screening tests ordered including cmp, cbc and lipids where appropriate.  Discussed recommendations regarding Vit D and calcium supplementation (see AVS)  Chronic disease f/u and/or acute problem visit: (deemed necessary to be done in addition to the wellness visit):  Hyperlipidemia: Recheck on statin.  Check LFTs  GERD is well controlled.  Continue PPI  History of prediabetes for recheck today.  He has gained weight.  De Quervain's tenosynovitis: Education and counseling given.  Start ice, NSAIDs and thumb spica for 2 weeks.  If not improving return for reevaluation.  Recommend weight loss  Anxiety is well controlled on chronic Paxil.  Follow up: Return in about 1 year (around 06/07/2021) for complete physical.   Commons side effects, risks, benefits, and alternatives for medications and treatment plan prescribed today were discussed, and the patient expressed understanding of the given instructions. Patient is instructed to call or message via MyChart if he/she has any questions or concerns regarding our treatment plan. No barriers to understanding were identified. We discussed Red Flag symptoms and signs in detail. Patient expressed understanding regarding what to do in case of urgent or emergency type symptoms.   Medication list was reconciled, printed and provided to the patient in AVS. Patient instructions and summary information was reviewed with the patient as documented in the AVS. This note was prepared with assistance of Dragon voice recognition software. Occasional wrong-word or sound-a-like substitutions may have occurred due to the inherent limitations of voice recognition software  This visit occurred during the SARS-CoV-2 public health emergency.  Safety protocols were in place, including screening questions prior to the visit, additional usage of staff PPE, and extensive cleaning of exam room while observing appropriate contact time as indicated for disinfecting  solutions.   Orders Placed This Encounter  Procedures  . CBC with Differential/Platelet  . Comprehensive metabolic panel  . Hemoglobin A1c  . Lipid panel   Meds ordered this encounter  Medications  . traZODone (DESYREL) 50 MG tablet    Sig: Take 1-2 tablets (50-100 mg total) by mouth at bedtime as needed for sleep.    Dispense:  90 tablet    Refill:  3  . diclofenac (VOLTAREN) 75 MG EC tablet    Sig: Take 1 tablet (75 mg total) by mouth 2 (two) times daily.    Dispense:  30 tablet    Refill:  0

## 2020-06-07 NOTE — Patient Instructions (Signed)
Please return in 12 months for your annual complete physical; please come fasting.  I will release your lab results to you on your MyChart account with further instructions. Please reply with any questions.   Let's try the trazadone again to help with your sleep. You may stop the zolpidem  If you have any questions or concerns, please don't hesitate to send me a message via MyChart or call the office at [phone]. Thank you for visiting with [place] today! It's our pleasure caring for you.   De Quervain's Tenosynovitis  De Quervain's tenosynovitis is a condition that causes inflammation of the tendon on the thumb side of the wrist. Tendons are cords of tissue that connect bones to muscles. The tendons in the hand pass through a tunnel called a sheath. A slippery layer of tissue (synovium) lets the tendons move smoothly in the sheath. With de Quervain's tenosynovitis, the sheath swells or thickens, causing friction and pain. The condition is also called de Quervain's disease and de Quervain's syndrome. It occurs most often in women who are 61-76 years old. What are the causes? The exact cause of this condition is not known. It may be associated with overuse of the hand and wrist. What increases the risk? You are more likely to develop this condition if you:  Use your hands far more than normal, especially if you repeat certain movements that involve twisting your hand or using a tight grip.  Are pregnant.  Are a middle-aged woman.  Have rheumatoid arthritis.  Have diabetes. What are the signs or symptoms? The main symptom of this condition is pain on the thumb side of the wrist. The pain may get worse when you grasp something or turn your wrist. Other symptoms may include:  Pain that extends up the forearm.  Swelling of your wrist and hand.  Trouble moving the thumb and wrist.  A sensation of snapping in the wrist.  A bump filled with fluid (cyst) in the area of the pain. How is  this diagnosed? This condition may be diagnosed based on:  Your symptoms and medical history.  A physical exam. During the exam, your health care provider may do a simple test Wynn Maudlin test) that involves pulling your thumb and wrist to see if this causes pain. You may also need to have an X-ray. How is this treated? Treatment for this condition may include:  Avoiding any activity that causes pain and swelling.  Taking medicines. Anti-inflammatory medicines and corticosteroid injections may be used to reduce inflammation and relieve pain.  Wearing a splint.  Having surgery. This may be needed if other treatments do not work. Once the pain and swelling has gone down:  Physical therapy. This includes stretching and strengthening exercises.  Occupational therapy. This includes adjusting how you move your wrist. Follow these instructions at home: If you have a splint:  Wear the splint as told by your health care provider. Remove it only as told by your health care provider.  Loosen the splint if your fingers tingle, become numb, or turn cold and blue.  Keep the splint clean.  If the splint is not waterproof: ? Do not let it get wet. ? Cover it with a watertight covering when you take a bath or a shower. Managing pain, stiffness, and swelling   Avoid movements and activities that cause pain and swelling in the wrist area.  If directed, put ice on the painful area. This may be helpful after doing activities that involve the sore  wrist. ? Put ice in a plastic bag. ? Place a towel between your skin and the bag. ? Leave the ice on for 20 minutes, 2-3 times a day.  Move your fingers often to avoid stiffness and to lessen swelling.  Raise (elevate) the injured area above the level of your heart while you are sitting or lying down. General instructions  Return to your normal activities as told by your health care provider. Ask your health care provider what activities are  safe for you.  Take over-the-counter and prescription medicines only as told by your health care provider.  Keep all follow-up visits as told by your health care provider. This is important. Contact a health care provider if:  Your pain medicine does not help.  Your pain gets worse.  You develop new symptoms. Summary  De Quervain's tenosynovitis is a condition that causes inflammation of the tendon on the thumb side of the wrist.  The condition occurs most often in women who are 47-10 years old.  The exact cause of this condition is not known. It may be associated with overuse of the hand and wrist.  Treatment starts with avoiding activity that causes pain or swelling in the wrist area. Other treatment may include wearing a splint and taking medicine. Sometimes, surgery is needed. This information is not intended to replace advice given to you by your health care provider. Make sure you discuss any questions you have with your health care provider. Document Revised: 04/11/2018 Document Reviewed: 09/17/2017 Elsevier Patient Education  2020 [employer].

## 2020-06-08 LAB — CBC WITH DIFFERENTIAL/PLATELET
Absolute Monocytes: 515 cells/uL (ref 200–950)
Basophils Absolute: 40 cells/uL (ref 0–200)
Basophils Relative: 0.6 %
Eosinophils Absolute: 152 cells/uL (ref 15–500)
Eosinophils Relative: 2.3 %
HCT: 43.8 % (ref 38.5–50.0)
Hemoglobin: 14.8 g/dL (ref 13.2–17.1)
Lymphs Abs: 2528 cells/uL (ref 850–3900)
MCH: 31.2 pg (ref 27.0–33.0)
MCHC: 33.8 g/dL (ref 32.0–36.0)
MCV: 92.4 fL (ref 80.0–100.0)
MPV: 12.7 fL — ABNORMAL HIGH (ref 7.5–12.5)
Monocytes Relative: 7.8 %
Neutro Abs: 3366 cells/uL (ref 1500–7800)
Neutrophils Relative %: 51 %
Platelets: 154 10*3/uL (ref 140–400)
RBC: 4.74 10*6/uL (ref 4.20–5.80)
RDW: 13.8 % (ref 11.0–15.0)
Total Lymphocyte: 38.3 %
WBC: 6.6 10*3/uL (ref 3.8–10.8)

## 2020-06-08 LAB — COMPREHENSIVE METABOLIC PANEL
AG Ratio: 1.6 (calc) (ref 1.0–2.5)
ALT: 22 U/L (ref 9–46)
AST: 16 U/L (ref 10–35)
Albumin: 4.2 g/dL (ref 3.6–5.1)
Alkaline phosphatase (APISO): 80 U/L (ref 35–144)
BUN: 20 mg/dL (ref 7–25)
CO2: 32 mmol/L (ref 20–32)
Calcium: 8.9 mg/dL (ref 8.6–10.3)
Chloride: 101 mmol/L (ref 98–110)
Creat: 1 mg/dL (ref 0.70–1.33)
Globulin: 2.6 g/dL (calc) (ref 1.9–3.7)
Glucose, Bld: 100 mg/dL — ABNORMAL HIGH (ref 65–99)
Potassium: 4.5 mmol/L (ref 3.5–5.3)
Sodium: 138 mmol/L (ref 135–146)
Total Bilirubin: 0.3 mg/dL (ref 0.2–1.2)
Total Protein: 6.8 g/dL (ref 6.1–8.1)

## 2020-06-08 LAB — LIPID PANEL
Cholesterol: 272 mg/dL — ABNORMAL HIGH (ref <200)
HDL: 49 mg/dL (ref >=40)
Non-HDL Cholesterol (Calc): 223 mg/dL (calc) — ABNORMAL HIGH (ref <130)
Total CHOL/HDL Ratio: 5.6 (calc) — ABNORMAL HIGH (ref <5.0)
Triglycerides: 614 mg/dL — ABNORMAL HIGH (ref <150)

## 2020-06-08 LAB — HEMOGLOBIN A1C
Hgb A1c MFr Bld: 6.2 % of total Hgb — ABNORMAL HIGH (ref <5.7)
Mean Plasma Glucose: 131 (calc)
eAG (mmol/L): 7.3 (calc)

## 2020-06-21 ENCOUNTER — Other Ambulatory Visit: Payer: Self-pay | Admitting: Family Medicine

## 2020-06-30 ENCOUNTER — Other Ambulatory Visit: Payer: Self-pay | Admitting: Family Medicine

## 2020-07-04 DIAGNOSIS — Z20822 Contact with and (suspected) exposure to covid-19: Secondary | ICD-10-CM | POA: Diagnosis not present

## 2020-07-14 ENCOUNTER — Other Ambulatory Visit: Payer: Self-pay

## 2020-07-14 DIAGNOSIS — R0683 Snoring: Secondary | ICD-10-CM

## 2020-07-14 DIAGNOSIS — R0681 Apnea, not elsewhere classified: Secondary | ICD-10-CM

## 2020-07-15 ENCOUNTER — Other Ambulatory Visit: Payer: Self-pay | Admitting: Family Medicine

## 2020-08-05 ENCOUNTER — Other Ambulatory Visit: Payer: Self-pay | Admitting: Family Medicine

## 2020-08-09 DIAGNOSIS — Z4889 Encounter for other specified surgical aftercare: Secondary | ICD-10-CM | POA: Diagnosis not present

## 2020-08-17 DIAGNOSIS — H11002 Unspecified pterygium of left eye: Secondary | ICD-10-CM | POA: Diagnosis not present

## 2020-08-23 ENCOUNTER — Other Ambulatory Visit: Payer: Self-pay | Admitting: Family Medicine

## 2020-08-23 HISTORY — PX: EYE SURGERY: SHX253

## 2020-09-01 ENCOUNTER — Other Ambulatory Visit: Payer: Self-pay | Admitting: Family Medicine

## 2020-09-01 DIAGNOSIS — H11001 Unspecified pterygium of right eye: Secondary | ICD-10-CM | POA: Diagnosis not present

## 2020-09-01 DIAGNOSIS — H11002 Unspecified pterygium of left eye: Secondary | ICD-10-CM | POA: Diagnosis not present

## 2020-09-01 DIAGNOSIS — Z01818 Encounter for other preprocedural examination: Secondary | ICD-10-CM | POA: Diagnosis not present

## 2020-09-06 ENCOUNTER — Ambulatory Visit: Payer: BC Managed Care – PPO | Admitting: Family Medicine

## 2020-09-10 ENCOUNTER — Other Ambulatory Visit: Payer: Self-pay | Admitting: Family Medicine

## 2020-09-21 ENCOUNTER — Ambulatory Visit: Payer: BC Managed Care – PPO | Admitting: Family Medicine

## 2020-09-27 ENCOUNTER — Other Ambulatory Visit: Payer: Self-pay

## 2020-09-27 ENCOUNTER — Ambulatory Visit (INDEPENDENT_AMBULATORY_CARE_PROVIDER_SITE_OTHER): Payer: BC Managed Care – PPO | Admitting: Family Medicine

## 2020-09-27 ENCOUNTER — Encounter: Payer: Self-pay | Admitting: Family Medicine

## 2020-09-27 VITALS — BP 128/88 | HR 81 | Temp 97.9°F | Ht 67.0 in | Wt 206.6 lb

## 2020-09-27 DIAGNOSIS — R7303 Prediabetes: Secondary | ICD-10-CM

## 2020-09-27 DIAGNOSIS — R1319 Other dysphagia: Secondary | ICD-10-CM

## 2020-09-27 DIAGNOSIS — E781 Pure hyperglyceridemia: Secondary | ICD-10-CM | POA: Diagnosis not present

## 2020-09-27 DIAGNOSIS — E782 Mixed hyperlipidemia: Secondary | ICD-10-CM

## 2020-09-27 LAB — POCT GLYCOSYLATED HEMOGLOBIN (HGB A1C): Hemoglobin A1C: 5.8 % — AB (ref 4.0–5.6)

## 2020-09-27 NOTE — Patient Instructions (Signed)
Please return in august 2022 for your annual complete physical; please come fasting.  I will release your lab results to you on your MyChart account with further instructions. Please reply with any questions.   Fat and Cholesterol Restricted Eating Plan Getting too much fat and cholesterol in your diet may cause health problems. Choosing the right foods helps keep your fat and cholesterol at normal levels. This can keep you from getting certain diseases. Your doctor may recommend an eating plan that includes:  Total fat: ______% or less of total calories a day.  Saturated fat: ______% or less of total calories a day.  Cholesterol: less than _________mg a day.  Fiber: ______g a day. What are tips for following this plan? Meal planning  At meals, divide your plate into four equal parts: ? Fill one-half of your plate with vegetables and green salads. ? Fill one-fourth of your plate with whole grains. ? Fill one-fourth of your plate with low-fat (lean) protein foods.  Eat fish that is high in omega-3 fats at least two times a week. This includes mackerel, tuna, sardines, and salmon.  Eat foods that are high in fiber, such as whole grains, beans, apples, broccoli, carrots, peas, and barley. General tips   Work with your doctor to lose weight if you need to.  Avoid: ? Foods with added sugar. ? Fried foods. ? Foods with partially hydrogenated oils.  Limit alcohol intake to no more than 1 drink a day for nonpregnant women and 2 drinks a day for men. One drink equals 12 oz of beer, 5 oz of wine, or 1 oz of hard liquor. Reading food labels  Check food labels for: ? Trans fats. ? Partially hydrogenated oils. ? Saturated fat (g) in each serving. ? Cholesterol (mg) in each serving. ? Fiber (g) in each serving.  Choose foods with healthy fats, such as: ? Monounsaturated fats. ? Polyunsaturated fats. ? Omega-3 fats.  Choose grain products that have whole grains. Look for the word  "whole" as the first word in the ingredient list. Cooking  Cook foods using low-fat methods. These include baking, boiling, grilling, and broiling.  Eat more home-cooked foods. Eat at restaurants and buffets less often.  Avoid cooking using saturated fats, such as butter, cream, palm oil, palm kernel oil, and coconut oil. Recommended foods  Fruits  All fresh, canned (in natural juice), or frozen fruits. Vegetables  Fresh or frozen vegetables (raw, steamed, roasted, or grilled). Green salads. Grains  Whole grains, such as whole wheat or whole grain breads, crackers, cereals, and pasta. Unsweetened oatmeal, bulgur, barley, quinoa, or brown rice. Corn or whole wheat flour tortillas. Meats and other protein foods  Ground beef (85% or leaner), grass-fed beef, or beef trimmed of fat. Skinless chicken or [place]. Ground chicken or [place]. Pork trimmed of fat. All fish and seafood. Egg whites. Dried beans, peas, or lentils. Unsalted nuts or seeds. Unsalted canned beans. Nut butters without added sugar or oil. Dairy  Low-fat or nonfat dairy products, such as skim or 1% milk, 2% or reduced-fat cheeses, low-fat and fat-free ricotta or cottage cheese, or plain low-fat and nonfat yogurt. Fats and oils  Tub margarine without trans fats. Light or reduced-fat mayonnaise and salad dressings. Avocado. Olive, canola, sesame, or safflower oils. The items listed above may not be a complete list of foods and beverages you can eat. Contact a dietitian for more information. Foods to avoid Fruits  Canned fruit in heavy syrup. Fruit in cream or butter  sauce. [place] fruit. Vegetables  Vegetables cooked in cheese, cream, or butter sauce. Fried vegetables. Grains  White bread. White pasta. White rice. Cornbread. Bagels, pastries, and croissants. Crackers and snack foods that contain trans fat and hydrogenated oils. Meats and other protein foods  Fatty cuts of meat. Ribs, chicken wings, bacon, sausage,  bologna, salami, chitterlings, fatback, hot dogs, bratwurst, and packaged lunch meats. Liver and organ meats. Whole eggs and egg yolks. Chicken and [place] with skin. Fried meat. Dairy  Whole or 2% milk, cream, half-and-half, and cream cheese. Whole milk cheeses. Whole-fat or sweetened yogurt. Full-fat cheeses. Nondairy creamers and whipped toppings. Processed cheese, cheese spreads, and cheese curds. Beverages  Alcohol. Sugar-sweetened drinks such as sodas, lemonade, and fruit drinks. Fats and oils  Butter, stick margarine, lard, shortening, ghee, or bacon fat. Coconut, palm kernel, and palm oils. Sweets and desserts  Corn syrup, sugars, honey, and molasses. Candy. Jam and jelly. Syrup. Sweetened cereals. Cookies, pies, cakes, donuts, muffins, and ice cream. The items listed above may not be a complete list of foods and beverages you should avoid. Contact a dietitian for more information. Summary  Choosing the right foods helps keep your fat and cholesterol at normal levels. This can keep you from getting certain diseases.  At meals, fill one-half of your plate with vegetables and green salads.  Eat high-fiber foods, like whole grains, beans, apples, carrots, peas, and barley.  Limit added sugar, saturated fats, alcohol, and fried foods. This information is not intended to replace advice given to you by your health care provider. Make sure you discuss any questions you have with your health care provider. Document Revised: 06/12/2018 Document Reviewed: 06/26/2017 Elsevier Patient Education  [employer].  If you have any questions or concerns, please don't hesitate to send me a message via MyChart or call the office at [phone]. Thank you for visiting with [place] today! It's our pleasure caring for you.

## 2020-09-27 NOTE — Addendum Note (Signed)
Addended by: Doran Clay A on: 09/27/2020 03:22 PM   Modules accepted: Orders

## 2020-09-27 NOTE — Progress Notes (Signed)
Subjective  CC:  Chief Complaint  Patient presents with  . Prediabetes  . Hyperlipidemia  . eye procedure    recently had tissue removed from left eye early last month    HPI: Ralph Phillips is a 59 y.o. male who presents to the office today for follow up of diabetes and problems listed above in the chief complaint.   Prediabetes by labs in august: follow up: Here for follow-up of sugars.  Denies symptoms of hyperglycemia.  Eats once a day.  Says his wife is a very good cook and he overeats.  He has been off all sugars.  His decrease carbohydrates.  Mixed HLD on statin with very elevated trigs: Reviewed recent lab work from August.  Triglycerides were greater than 600.  He did not take fish oil.  He is on a statin.  He is trying to eat less fatty foods.  Weight is down a few pounds.  He is fasting for recheck.  History of esophageal dysphagia status post work-up by GI.  On long-term PPIs although he does not feel that he has reflux problems.  He is concerned about the possible association between long-term PPI use and early dementia.  His father suffered from early dementia.   Wt Readings from Last 3 Encounters:  09/27/20 206 lb 9.6 oz (93.7 kg)  06/07/20 209 lb (94.8 kg)  09/17/18 199 lb (90.3 kg)    BP Readings from Last 3 Encounters:  09/27/20 128/88  06/07/20 128/80  09/17/18 120/70    Assessment  1. Prediabetes   2. Mixed hyperlipidemia   3. Hypertriglyceridemia   4. Esophageal dysphagia      Plan   Prediabetes follow-up: Improved A1c today.  Continue low-carb diet.  Continue weight loss.  Next hyperlipidemia and hypertriglyceridemia here for fasting recheck.  Discussed low-fat diet.  Will adjust statin and add omega-3's if needed.  Esophageal dysphagia: May stop PPIs and monitor symptoms.  Follow up: August for complete physical. Orders Placed This Encounter  Procedures  . Lipid panel  . POCT HgB A1C   No orders of the defined types were placed in  this encounter.     Immunization History  Administered Date(s) Administered  . Influenza Split 08/16/2012  . Influenza,inj,Quad PF,6+ Mos 07/27/2015, 07/27/2016  . PFIZER SARS-COV-2 Vaccination 04/21/2020, 05/22/2020  . Tdap 01/28/2014    Diabetes Related Lab Review: Lab Results  Component Value Date   HGBA1C 5.8 (A) 09/27/2020   HGBA1C 6.2 (H) 06/07/2020   HGBA1C 5.7 (A) 08/30/2018    No results found for: Derl Barrow Lab Results  Component Value Date   CREATININE 1.00 06/07/2020   BUN 20 06/07/2020   NA 138 06/07/2020   K 4.5 06/07/2020   CL 101 06/07/2020   CO2 32 06/07/2020   Lab Results  Component Value Date   CHOL 272 (H) 06/07/2020   CHOL 208 (H) 08/30/2018   CHOL 191 04/03/2018   Lab Results  Component Value Date   HDL 49 06/07/2020   HDL 48.20 08/30/2018   HDL 43.50 04/03/2018   Lab Results  Component Value Date   LDLCALC  06/07/2020     Comment:     . LDL cholesterol not calculated. Triglyceride levels greater than 400 mg/dL invalidate calculated LDL results. . Reference range: <100 . Desirable range <100 mg/dL for primary prevention;   <70 mg/dL for patients with CHD or diabetic patients  with > or = 2 CHD risk factors. Marland Kitchen LDL-C is now calculated using  the Dewaun-Hopkins  calculation, which is a validated novel method providing  better accuracy than the Friedewald equation in the  estimation of LDL-C.  Cresenciano Genre et al. Annamaria Helling. 3536;144(31): 2061-2068  (http://education.[facility].com/faq/FAQ164)    LDLCALC 115 (H) 04/03/2018   LDLCALC 121 (H) 08/21/2017   Lab Results  Component Value Date   TRIG 614 (H) 06/07/2020   TRIG 241.0 (H) 08/30/2018   TRIG 162.0 (H) 04/03/2018   Lab Results  Component Value Date   CHOLHDL 5.6 (H) 06/07/2020   CHOLHDL 4 08/30/2018   CHOLHDL 4 04/03/2018   Lab Results  Component Value Date   LDLDIRECT 134.0 08/30/2018   LDLDIRECT 94.4 04/15/2013   LDLDIRECT 199.5 08/16/2012   The 10-year  ASCVD risk score Mikey Bussing DC Jr., et al., 2013) is: 9.4%   Values used to calculate the score:     Age: 32 years     Sex: Male     Is Non-Hispanic African American: No     Diabetic: No     Tobacco smoker: No     Systolic Blood Pressure: 540 mmHg     Is BP treated: No     HDL Cholesterol: 49 mg/dL     Total Cholesterol: 272 mg/dL I have reviewed the PMH, Fam and Soc history. Patient Active Problem List   Diagnosis Date Noted  . Mixed hyperlipidemia 04/03/2018    Priority: High  . Cervical radiculopathy 04/16/2017    Priority: High    -sees Dr. Nelva Bush -R   . GAD (generalized anxiety disorder) 01/01/2017    Priority: High  . Gastroesophageal reflux disease without esophagitis 04/03/2018    Priority: Medium  . Esophageal dysphagia 04/03/2018    Priority: Medium  . Diverticulosis 12/11/2017    Priority: Medium    By colonoscopy, Dr. Henrene Pastor, 2015   . Obesity 09/20/2017    Priority: Medium  . Insomnia 01/01/2017    Priority: Medium  . Hypogonadism male 09/09/2013    Priority: Medium  . Prediabetes 09/09/2013    Priority: Medium  . Carpal tunnel syndrome of left wrist 06/18/2018    Priority: Low  . Carpal tunnel syndrome of right wrist 01/09/2018    Priority: Low  . Chronic allergic rhinitis 01/01/2017    Priority: Low    Social History: Patient  reports that he quit smoking about 18 years ago. His smoking use included cigarettes. He has a 5.00 pack-year smoking history. He has never used smokeless tobacco. He reports current alcohol use. He reports that he does not use drugs.  Review of Systems: Ophthalmic: negative for eye pain, loss of vision or double vision Cardiovascular: negative for chest pain Respiratory: negative for SOB or persistent cough Gastrointestinal: negative for abdominal pain Genitourinary: negative for dysuria or gross hematuria MSK: negative for foot lesions Neurologic: negative for weakness or gait disturbance  Objective  Vitals: BP 128/88    Pulse 81   Temp 97.9 F (36.6 C) (Temporal)   Ht 5\' 7"  (1.702 m)   Wt 206 lb 9.6 oz (93.7 kg)   SpO2 96%   BMI 32.36 kg/m  General: well appearing, no acute distress  Psych:  Alert and oriented, normal mood and affect  Diabetic education: ongoing education regarding chronic disease management for diabetes was given today. We continue to reinforce the ABC's of diabetic management: A1c (<7 or 8 dependent upon patient), tight blood pressure control, and cholesterol management with goal LDL < 100 minimally. We discuss diet strategies, exercise recommendations, medication options and possible side  effects. At each visit, we review recommended immunizations and preventive care recommendations for diabetics and stress that good diabetic control can prevent other problems. See below for this patient's data.    Commons side effects, risks, benefits, and alternatives for medications and treatment plan prescribed today were discussed, and the patient expressed understanding of the given instructions. Patient is instructed to call or message via MyChart if he/she has any questions or concerns regarding our treatment plan. No barriers to understanding were identified. We discussed Red Flag symptoms and signs in detail. Patient expressed understanding regarding what to do in case of urgent or emergency type symptoms.   Medication list was reconciled, printed and provided to the patient in AVS. Patient instructions and summary information was reviewed with the patient as documented in the AVS. This note was prepared with assistance of Dragon voice recognition software. Occasional wrong-word or sound-a-like substitutions may have occurred due to the inherent limitations of voice recognition software  This visit occurred during the SARS-CoV-2 public health emergency.  Safety protocols were in place, including screening questions prior to the visit, additional usage of staff PPE, and extensive cleaning of exam  room while observing appropriate contact time as indicated for disinfecting solutions.

## 2020-09-28 LAB — LIPID PANEL
Cholesterol: 194 mg/dL (ref <200)
HDL: 82 mg/dL (ref >=40)
LDL Cholesterol (Calc): 98 mg/dL (calc)
Non-HDL Cholesterol (Calc): 112 mg/dL (calc) (ref <130)
Total CHOL/HDL Ratio: 2.4 (calc) (ref <5.0)
Triglycerides: 55 mg/dL (ref <150)

## 2020-10-01 ENCOUNTER — Other Ambulatory Visit: Payer: Self-pay | Admitting: Family Medicine

## 2020-10-01 NOTE — Telephone Encounter (Signed)
OK to reorder.

## 2020-11-03 ENCOUNTER — Telehealth: Payer: Self-pay

## 2020-11-03 ENCOUNTER — Emergency Department (HOSPITAL_BASED_OUTPATIENT_CLINIC_OR_DEPARTMENT_OTHER): Payer: BC Managed Care – PPO

## 2020-11-03 ENCOUNTER — Other Ambulatory Visit: Payer: Self-pay

## 2020-11-03 ENCOUNTER — Encounter (HOSPITAL_BASED_OUTPATIENT_CLINIC_OR_DEPARTMENT_OTHER): Payer: Self-pay | Admitting: *Deleted

## 2020-11-03 ENCOUNTER — Other Ambulatory Visit: Payer: Self-pay | Admitting: Family Medicine

## 2020-11-03 ENCOUNTER — Emergency Department (HOSPITAL_BASED_OUTPATIENT_CLINIC_OR_DEPARTMENT_OTHER)
Admission: EM | Admit: 2020-11-03 | Discharge: 2020-11-03 | Disposition: A | Discharge status: Home / Self Care | Payer: BC Managed Care – PPO | Attending: Emergency Medicine | Admitting: Emergency Medicine

## 2020-11-03 DIAGNOSIS — S301XXA Contusion of abdominal wall, initial encounter: Secondary | ICD-10-CM | POA: Insufficient documentation

## 2020-11-03 DIAGNOSIS — S3991XA Unspecified injury of abdomen, initial encounter: Secondary | ICD-10-CM | POA: Diagnosis not present

## 2020-11-03 DIAGNOSIS — Z87891 Personal history of nicotine dependence: Secondary | ICD-10-CM | POA: Diagnosis not present

## 2020-11-03 DIAGNOSIS — K219 Gastro-esophageal reflux disease without esophagitis: Secondary | ICD-10-CM | POA: Diagnosis not present

## 2020-11-03 DIAGNOSIS — X58XXXA Exposure to other specified factors, initial encounter: Secondary | ICD-10-CM | POA: Insufficient documentation

## 2020-11-03 DIAGNOSIS — I2510 Atherosclerotic heart disease of native coronary artery without angina pectoris: Secondary | ICD-10-CM | POA: Insufficient documentation

## 2020-11-03 DIAGNOSIS — R059 Cough, unspecified: Secondary | ICD-10-CM | POA: Insufficient documentation

## 2020-11-03 DIAGNOSIS — Z7982 Long term (current) use of aspirin: Secondary | ICD-10-CM | POA: Insufficient documentation

## 2020-11-03 DIAGNOSIS — R109 Unspecified abdominal pain: Secondary | ICD-10-CM | POA: Diagnosis not present

## 2020-11-03 LAB — COMPREHENSIVE METABOLIC PANEL
ALT: 27 U/L (ref 0–44)
AST: 26 U/L (ref 15–41)
Albumin: 4.1 g/dL (ref 3.5–5.0)
Alkaline Phosphatase: 71 U/L (ref 38–126)
Anion gap: 11 (ref 5–15)
BUN: 16 mg/dL (ref 6–20)
CO2: 25 mmol/L (ref 22–32)
Calcium: 9.3 mg/dL (ref 8.9–10.3)
Chloride: 102 mmol/L (ref 98–111)
Creatinine, Ser: 0.97 mg/dL (ref 0.61–1.24)
GFR, Estimated: >60 mL/min (ref >60)
Glucose, Bld: 159 mg/dL — ABNORMAL HIGH (ref 70–99)
Potassium: 4.2 mmol/L (ref 3.5–5.1)
Sodium: 138 mmol/L (ref 135–145)
Total Bilirubin: 0.4 mg/dL (ref 0.3–1.2)
Total Protein: 7.5 g/dL (ref 6.5–8.1)

## 2020-11-03 LAB — CBC WITH DIFFERENTIAL/PLATELET
Abs Immature Granulocytes: 0.01 10*3/uL (ref 0.00–0.07)
Basophils Absolute: 0 10*3/uL (ref 0.0–0.1)
Basophils Relative: 0 %
Eosinophils Absolute: 0.1 10*3/uL (ref 0.0–0.5)
Eosinophils Relative: 1 %
HCT: 45.3 % (ref 39.0–52.0)
Hemoglobin: 15.9 g/dL (ref 13.0–17.0)
Immature Granulocytes: 0 %
Lymphocytes Relative: 21 %
Lymphs Abs: 1.4 10*3/uL (ref 0.7–4.0)
MCH: 32.1 pg (ref 26.0–34.0)
MCHC: 35.1 g/dL (ref 30.0–36.0)
MCV: 91.5 fL (ref 80.0–100.0)
Monocytes Absolute: 0.4 10*3/uL (ref 0.1–1.0)
Monocytes Relative: 5 %
Neutro Abs: 4.8 10*3/uL (ref 1.7–7.7)
Neutrophils Relative %: 73 %
Platelets: 169 10*3/uL (ref 150–400)
RBC: 4.95 MIL/uL (ref 4.22–5.81)
RDW: 13.5 % (ref 11.5–15.5)
WBC: 6.7 10*3/uL (ref 4.0–10.5)
nRBC: 0 % (ref 0.0–0.2)

## 2020-11-03 LAB — LIPASE, BLOOD: Lipase: 70 U/L — ABNORMAL HIGH (ref 11–51)

## 2020-11-03 MED ORDER — IOHEXOL 300 MG/ML  SOLN
100.0000 mL | Freq: Once | INTRAMUSCULAR | Status: AC | PRN
  Administered 2020-11-03: 100 mL via INTRAVENOUS

## 2020-11-03 NOTE — Telephone Encounter (Signed)
   Nurse Assessment Nurse: Jimmey Ralph, RN, Lissa Date/Time (Eastern Time): 11/03/2020 3:55:02 PM Confirm and document reason for call. If symptomatic, describe symptoms. ---Caller States: I feel like I have a balloon in my abdomen on the left side, I think it may be an enlarged spleen and painful lump. He just got back from [place] on Jan 5th. Pain is 3/10 if I don't move. If I cough or sneeze the pain is 9/10. He also has some bruises, but no known injury. Jan 3rd I received hair implants with lots of anesthesia. The pain is on left upper abdomen under rib. Does the patient have any new or worsening symptoms? ---Yes Will a triage be completed? ---Yes Related visit to physician within the last 2 weeks? ---Yes Does the PT have any chronic conditions? (i.e. diabetes, asthma, this includes High risk factors for pregnancy, etc.) ---Yes List chronic conditions. ---High cholesterol prediabetic Is this a behavioral health or substance abuse call? ---No Guidelines Guideline Title Affirmed Question Affirmed Notes Nurse Date/Time (Eastern Time) Abdominal Pain - Upper [1] Pain lasts > 10 minutes AND [2] age > 36 Hammonds, RN, Lissa 11/03/2020 3:59:13 PM PLEASE NOTE: All timestamps contained within this report are represented as [place] Standard Time. CONFIDENTIALTY NOTICE: This fax transmission is intended only for the addressee. It contains information that is legally privileged, confidential or otherwise protected from use or disclosure. If you are not the intended recipient, you are strictly prohibited from reviewing, disclosing, copying using or disseminating any of this information or taking any action in reliance on or regarding this information. If you have received this fax in error, please notify us immediately by telephone so that we can arrange for its return to [place]. Phone: [phone], Toll-Free: [phone], Fax: [phone] Page: 2 of 2 Call Id: [id number] [place]. Time Eilene Ghazi Time)  Disposition Final User 11/03/2020 3:53:28 PM Send to Urgent Queue Cecille Aver 11/03/2020 4:02:02 PM Go to ED Now Yes Hammonds, RN, Lissa Caller Disagree/Comply Comply Caller Understands Yes PreDisposition Did not know what to do Care Advice Given Per Guideline GO TO ED NOW: * You need to be seen in the Emergency Department. DRIVING: * Another adult should drive. BRING MEDICINES: * Bring a list of your current medicines when you go to the Emergency Department (ER). CALL EMS 911 IF: * Confusion occurs * Passes out or becomes too weak to stand * Severe difficulty breathing occurs. CARE ADVICE given per Abdominal Pain, Upper (Adult) guideline. Comments User: Moses Manners, RN Date/Time Eilene Ghazi Time): 11/03/2020 3:59:09 PM Negative covid test User: Moses Manners, RN Date/Time Eilene Ghazi Time): 11/03/2020 4:00:50 PM I have HTN Referrals GO TO FACILITY OTHER - SPECIFY

## 2020-11-03 NOTE — ED Notes (Signed)
Patient transported to CT 

## 2020-11-03 NOTE — ED Triage Notes (Signed)
C/o left abd pain and bruising  X 1 week increased pain with cough or movt  , post op hair implants , sent here by PMd for eval
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
## 2020-11-04 ENCOUNTER — Encounter: Payer: Self-pay | Admitting: Family Medicine

## 2020-11-04 NOTE — Telephone Encounter (Signed)
FYI, patient seen at [facility] last night

## 2020-11-11 ENCOUNTER — Ambulatory Visit (INDEPENDENT_AMBULATORY_CARE_PROVIDER_SITE_OTHER): Payer: BC Managed Care – PPO | Admitting: Family Medicine

## 2020-11-11 ENCOUNTER — Encounter: Payer: Self-pay | Admitting: Family Medicine

## 2020-11-11 ENCOUNTER — Other Ambulatory Visit: Payer: Self-pay

## 2020-11-11 VITALS — BP 120/82 | HR 93 | Temp 98.0°F | Wt 203.2 lb

## 2020-11-11 DIAGNOSIS — F5101 Primary insomnia: Secondary | ICD-10-CM | POA: Diagnosis not present

## 2020-11-11 DIAGNOSIS — S301XXD Contusion of abdominal wall, subsequent encounter: Secondary | ICD-10-CM | POA: Diagnosis not present

## 2020-11-11 DIAGNOSIS — R7303 Prediabetes: Secondary | ICD-10-CM

## 2020-11-11 DIAGNOSIS — R58 Hemorrhage, not elsewhere classified: Secondary | ICD-10-CM | POA: Diagnosis not present

## 2020-11-11 DIAGNOSIS — F411 Generalized anxiety disorder: Secondary | ICD-10-CM | POA: Diagnosis not present

## 2020-11-11 LAB — CBC WITH DIFFERENTIAL/PLATELET
Basophils Absolute: 0 10*3/uL (ref 0.0–0.1)
Basophils Relative: 0.6 % (ref 0.0–3.0)
Eosinophils Absolute: 0.2 10*3/uL (ref 0.0–0.7)
Eosinophils Relative: 2.8 % (ref 0.0–5.0)
HCT: 45.1 % (ref 39.0–52.0)
Hemoglobin: 15.2 g/dL (ref 13.0–17.0)
Lymphocytes Relative: 40.4 % (ref 12.0–46.0)
Lymphs Abs: 2.8 10*3/uL (ref 0.7–4.0)
MCHC: 33.7 g/dL (ref 30.0–36.0)
MCV: 93.7 fl (ref 78.0–100.0)
Monocytes Absolute: 0.5 10*3/uL (ref 0.1–1.0)
Monocytes Relative: 6.6 % (ref 3.0–12.0)
Neutro Abs: 3.4 10*3/uL (ref 1.4–7.7)
Neutrophils Relative %: 49.6 % (ref 43.0–77.0)
Platelets: 173 10*3/uL (ref 150.0–400.0)
RBC: 4.81 Mil/uL (ref 4.22–5.81)
RDW: 14.5 % (ref 11.5–15.5)
WBC: 6.9 10*3/uL (ref 4.0–10.5)

## 2020-11-11 LAB — TSH: TSH: 7.78 u[IU]/mL — ABNORMAL HIGH (ref 0.35–4.50)

## 2020-11-11 MED ORDER — TRAZODONE HCL 50 MG PO TABS
100.0000 mg | ORAL_TABLET | Freq: Every evening | ORAL | 3 refills | Status: DC | PRN
Start: 2020-11-11 — End: 2021-07-12

## 2020-11-11 NOTE — Patient Instructions (Signed)
Please return in august for your complete physical.   Please stop the aspirin, glucosamine and seroquel.   I will release your lab results to you on your MyChart account with further instructions. Please reply with any questions.   If you have any questions or concerns, please don't hesitate to send me a message via MyChart or call the office at [phone]. Thank you for visiting with Ralph Phillips today! It's our pleasure caring for you.

## 2020-11-11 NOTE — Progress Notes (Signed)
Subjective  CC:  Chief Complaint  Patient presents with  . Follow-up    Abdominal wall hematoma, came back after having hair implant surgery in [place] - experienced black stool and bruising on left ribs/hip   . Hypertension    BP readings at home averaging around 140-150's/90's    HPI: Ralph Phillips is a 60 y.o. male who presents to the office today to address the problems listed above in the chief complaint.  ED follow-up: I reviewed records from recent emergency room visit.  Patient presented with left-sided abdominal pain with bruising.  His work-up revealed normal blood work.  CT scan showed some subdermal edema and a small hematoma in the abdominal wall musculature.  He has a large area of ecchymosis on his left lateral chest and abdominal wall that is improving.  He is here to try to figure out what happened.  He reports he was in [place] and had mild URI symptoms.  He denies any trauma, sporting or exercise activities, falls.  He noted pain in that when he noted the bruising.  He denies any other areas of bruising.  No history of easy bruising.  No nosebleeds, hemarthrosis, bleeding of the gums or problems with bleeding during surgeries.  He was taking multiple supplements including glucosamine and aspirin.  He remains mildly sore in the area but is improving.  He has had no fevers, chills or other GI symptoms.  Anxiety disorder and insomnia: I had him on trazodone.  He was requiring 100 mg nightly to be helpful.  While in [place], he switched to Seroquel which is over-the-counter.  He remains on Paxil for his anxiety and feels that it is well controlled.  Prediabetes and hypertension: He reports home blood pressures are mildly elevated but they are normal here in the office today.  He continues to watch his diet.   Assessment  1. Abdominal wall hematoma, subsequent encounter   2. Prediabetes   3. GAD (generalized anxiety disorder)   4. Primary insomnia   5. Ecchymosis       Plan   Abdominal wall hematoma and left-sided ecchymoses: Unclear etiology.  Patient adamantly denies any trauma or falls.  We will start work-up for coagulopathy.  Fortunately, CT scan did not show any other pathology.  He is improving.  Reassured.  Would recommend holding glucosamine and aspirin at this time.  Anxiety disorder insomnia: Recommend stopping Seroquel due to high risk medication.  Return to trazodone, take 100 250 mg nightly for sleep.  Patient agrees  Anxiety: Continue Paxil.  Prediabetes and hypertension: Discussed continuing healthy diet, limiting salt.  Continuing exercise.  Will monitor over time.  Discussed that this is another reason why would not treat with Seroquel at this time.  Patient agrees.  Follow up: Return in about 7 months (around 06/11/2021) for complete physical.  Visit date not found  Orders Placed This Encounter  Procedures  . CBC with Differential/Platelet  . PT and PTT  . von Willebrand Factor Screen  . TSH  . Von Willebrand Antigen   Meds ordered this encounter  Medications  . traZODone (DESYREL) 50 MG tablet    Sig: Take 2-3 tablets (100-150 mg total) by mouth at bedtime as needed for sleep.    Dispense:  180 tablet    Refill:  3      I reviewed the patients updated PMH, FH, and SocHx.    Patient Active Problem List   Diagnosis Date Noted  . Mixed hyperlipidemia 04/03/2018  Priority: High  . Cervical radiculopathy 04/16/2017    Priority: High  . GAD (generalized anxiety disorder) 01/01/2017    Priority: High  . Gastroesophageal reflux disease without esophagitis 04/03/2018    Priority: Medium  . Esophageal dysphagia 04/03/2018    Priority: Medium  . Diverticulosis 12/11/2017    Priority: Medium  . Obesity 09/20/2017    Priority: Medium  . Insomnia 01/01/2017    Priority: Medium  . Hypogonadism male 09/09/2013    Priority: Medium  . Prediabetes 09/09/2013    Priority: Medium  . Carpal tunnel syndrome of left wrist  06/18/2018    Priority: Low  . Carpal tunnel syndrome of right wrist 01/09/2018    Priority: Low  . Chronic allergic rhinitis 01/01/2017    Priority: Low   Current Meds  Medication Sig  . fluticasone (FLONASE) 50 MCG/ACT nasal spray PLACE 1 SPRAY INTO BOTH NOSTRILS DAILY  . Glucosamine-Chondroitin (GLUCOSAMINE CHONDR COMPLEX PO) Take by mouth daily.   . IBUPROFEN PO Take by mouth as needed.  Marland Kitchen levocetirizine (XYZAL) 5 MG tablet TAKE 1 TABLET BY MOUTH EVERY DAY IN THE EVENING  . Melatonin 10 MG TABS Take by mouth 2 (two) times daily.  . Misc Natural Products (CORTISOL PO) Take by mouth.  . Misc Natural Products (PROSTATE SUPPORT PO) Take by mouth.  . Multiple Vitamin (MULTIVITAMIN WITH MINERALS) TABS tablet Take 1 tablet by mouth daily. Miracle 2000  . OVER THE COUNTER MEDICATION Take 1 capsule by mouth 2 (two) times daily. Prosvent dietary supplement - Vitamin D3, Zinc, Black Pepper, Nettle Root, Sterol Esters, Pumpkin Seed, Saw Palmetto, Pygeum Africanum and Lycopene  . PARoxetine (PAXIL) 20 MG tablet TAKE 1 TABLET BY MOUTH EVERY DAY  . prednisoLONE (PRELONE) 15 MG/5ML SOLN Take by mouth daily before breakfast.  . simvastatin (ZOCOR) 20 MG tablet TAKE 1 TABLET BY MOUTH EVERYDAY AT BEDTIME  . [DISCONTINUED] aspirin EC 81 MG tablet Take 81 mg by mouth daily.  . [DISCONTINUED] QUEtiapine (SEROQUEL) 100 MG tablet Take 100 mg by mouth at bedtime. 0.5-1 tablet at bedtime    Allergies: Patient has No Known Allergies. Family History: Patient family history includes Alzheimer's disease in his father; Arthritis in his maternal grandmother and mother; Colon cancer in his maternal uncle; Colon polyps (age of onset: 48) in his father; Heart disease in his paternal grandmother; Hyperlipidemia in his father; Parkinson's disease in his mother; Stroke in his maternal grandfather. Social History:  Patient  reports that he quit smoking about 19 years ago. His smoking use included cigarettes. He has a  5.00 pack-year smoking history. He has never used smokeless tobacco. He reports current alcohol use. He reports that he does not use drugs.  Review of Systems: Constitutional: Negative for fever malaise or anorexia Cardiovascular: negative for chest pain Respiratory: negative for SOB or persistent cough Gastrointestinal: negative for abdominal pain  Objective  Vitals: BP 120/82   Pulse 93   Temp 98 F (36.7 C) (Temporal)   Wt 203 lb 3.2 oz (92.2 kg)   SpO2 97%   BMI 31.83 kg/m  General: no acute distress , A&Ox3, appears well Left abdominal wall with 6 x 8 inch approximately oval resolving ecchymotic area with overlying tenderness.  No masses palpated. Benign abdomen otherwise Cardiovascular:  RRR without murmur or gallop.  Respiratory:  Good breath sounds bilaterally, CTAB with normal respiratory effort Skin:  Warm, no rashes     Commons side effects, risks, benefits, and alternatives for medications and treatment plan prescribed  today were discussed, and the patient expressed understanding of the given instructions. Patient is instructed to call or message via MyChart if he/she has any questions or concerns regarding our treatment plan. No barriers to understanding were identified. We discussed Red Flag symptoms and signs in detail. Patient expressed understanding regarding what to do in case of urgent or emergency type symptoms.   Medication list was reconciled, printed and provided to the patient in AVS. Patient instructions and summary information was reviewed with the patient as documented in the AVS. This note was prepared with assistance of Dragon voice recognition software. Occasional wrong-word or sound-a-like substitutions may have occurred due to the inherent limitations of voice recognition software  This visit occurred during the SARS-CoV-2 public health emergency.  Safety protocols were in place, including screening questions prior to the visit, additional usage of  staff PPE, and extensive cleaning of exam room while observing appropriate contact time as indicated for disinfecting solutions.

## 2020-11-12 LAB — PT AND PTT
INR: 0.9 (ref 0.9–1.2)
Prothrombin Time: 9.9 s (ref 9.1–12.0)
aPTT: 24 s (ref 24–33)

## 2020-11-15 ENCOUNTER — Encounter: Payer: Self-pay | Admitting: Family Medicine

## 2020-11-15 LAB — VON WILLEBRAND ANTIGEN: Von Willebrand Antigen, Plasma: 212 % (ref 50–217)

## 2020-11-15 NOTE — Progress Notes (Signed)
Please add on free t4 and T3, dx: abnormal tsh Thanks, Dr. Jonni Sanger '

## 2020-11-16 ENCOUNTER — Other Ambulatory Visit: Payer: Self-pay

## 2020-11-16 DIAGNOSIS — R7989 Other specified abnormal findings of blood chemistry: Secondary | ICD-10-CM

## 2020-11-17 DIAGNOSIS — R202 Paresthesia of skin: Secondary | ICD-10-CM | POA: Diagnosis not present

## 2020-11-17 DIAGNOSIS — M5412 Radiculopathy, cervical region: Secondary | ICD-10-CM | POA: Diagnosis not present

## 2020-11-18 ENCOUNTER — Other Ambulatory Visit (INDEPENDENT_AMBULATORY_CARE_PROVIDER_SITE_OTHER): Payer: BC Managed Care – PPO

## 2020-11-18 ENCOUNTER — Other Ambulatory Visit: Payer: Self-pay

## 2020-11-18 DIAGNOSIS — R7989 Other specified abnormal findings of blood chemistry: Secondary | ICD-10-CM

## 2020-11-18 NOTE — Addendum Note (Signed)
Addended by: Adah Salvage F on: 11/18/2020 09:38 AM   Modules accepted: Orders

## 2020-11-19 ENCOUNTER — Encounter: Payer: Self-pay | Admitting: Family Medicine

## 2020-11-19 LAB — T3: T3, Total: 120 ng/dL (ref 76–181)

## 2020-11-19 LAB — T4, FREE: Free T4: 1 ng/dL (ref 0.8–1.8)

## 2020-11-22 ENCOUNTER — Other Ambulatory Visit: Payer: Self-pay | Admitting: Family Medicine

## 2020-11-24 ENCOUNTER — Other Ambulatory Visit: Payer: Self-pay | Admitting: Family Medicine

## 2020-12-07 DIAGNOSIS — M5412 Radiculopathy, cervical region: Secondary | ICD-10-CM | POA: Diagnosis not present

## 2020-12-07 DIAGNOSIS — H11002 Unspecified pterygium of left eye: Secondary | ICD-10-CM | POA: Diagnosis not present

## 2020-12-29 DIAGNOSIS — E010 Iodine-deficiency related diffuse (endemic) goiter: Secondary | ICD-10-CM | POA: Diagnosis not present

## 2020-12-29 DIAGNOSIS — R7301 Impaired fasting glucose: Secondary | ICD-10-CM | POA: Diagnosis not present

## 2020-12-29 DIAGNOSIS — E7800 Pure hypercholesterolemia, unspecified: Secondary | ICD-10-CM | POA: Diagnosis not present

## 2020-12-29 DIAGNOSIS — E039 Hypothyroidism, unspecified: Secondary | ICD-10-CM | POA: Diagnosis not present

## 2021-01-25 DIAGNOSIS — M25561 Pain in right knee: Secondary | ICD-10-CM | POA: Diagnosis not present

## 2021-01-28 ENCOUNTER — Other Ambulatory Visit: Payer: Self-pay | Admitting: Family Medicine

## 2021-02-04 DIAGNOSIS — M25561 Pain in right knee: Secondary | ICD-10-CM | POA: Diagnosis not present

## 2021-02-17 DIAGNOSIS — M25561 Pain in right knee: Secondary | ICD-10-CM | POA: Diagnosis not present

## 2021-03-04 DIAGNOSIS — M25561 Pain in right knee: Secondary | ICD-10-CM | POA: Diagnosis not present

## 2021-03-10 DIAGNOSIS — E039 Hypothyroidism, unspecified: Secondary | ICD-10-CM | POA: Diagnosis not present

## 2021-03-10 DIAGNOSIS — E010 Iodine-deficiency related diffuse (endemic) goiter: Secondary | ICD-10-CM | POA: Diagnosis not present

## 2021-04-16 ENCOUNTER — Telehealth: Payer: BC Managed Care – PPO | Admitting: Orthopedic Surgery

## 2021-04-16 ENCOUNTER — Emergency Department (HOSPITAL_BASED_OUTPATIENT_CLINIC_OR_DEPARTMENT_OTHER): Payer: BC Managed Care – PPO

## 2021-04-16 ENCOUNTER — Emergency Department (HOSPITAL_BASED_OUTPATIENT_CLINIC_OR_DEPARTMENT_OTHER): Payer: BC Managed Care – PPO | Admitting: Radiology

## 2021-04-16 ENCOUNTER — Other Ambulatory Visit: Payer: Self-pay

## 2021-04-16 ENCOUNTER — Other Ambulatory Visit (HOSPITAL_BASED_OUTPATIENT_CLINIC_OR_DEPARTMENT_OTHER): Payer: Self-pay

## 2021-04-16 ENCOUNTER — Encounter (HOSPITAL_BASED_OUTPATIENT_CLINIC_OR_DEPARTMENT_OTHER): Payer: Self-pay | Admitting: *Deleted

## 2021-04-16 ENCOUNTER — Emergency Department (HOSPITAL_BASED_OUTPATIENT_CLINIC_OR_DEPARTMENT_OTHER)
Admission: EM | Admit: 2021-04-16 | Discharge: 2021-04-16 | Disposition: A | Discharge status: Home / Self Care | Payer: BC Managed Care – PPO | Attending: Emergency Medicine | Admitting: Emergency Medicine

## 2021-04-16 DIAGNOSIS — Z87891 Personal history of nicotine dependence: Secondary | ICD-10-CM | POA: Insufficient documentation

## 2021-04-16 DIAGNOSIS — R000 Tachycardia, unspecified: Secondary | ICD-10-CM | POA: Diagnosis not present

## 2021-04-16 DIAGNOSIS — R0602 Shortness of breath: Secondary | ICD-10-CM | POA: Diagnosis not present

## 2021-04-16 DIAGNOSIS — U071 COVID-19: Secondary | ICD-10-CM | POA: Diagnosis not present

## 2021-04-16 DIAGNOSIS — M545 Low back pain, unspecified: Secondary | ICD-10-CM | POA: Diagnosis not present

## 2021-04-16 DIAGNOSIS — S63636A Sprain of interphalangeal joint of right little finger, initial encounter: Secondary | ICD-10-CM

## 2021-04-16 DIAGNOSIS — R042 Hemoptysis: Secondary | ICD-10-CM | POA: Diagnosis not present

## 2021-04-16 DIAGNOSIS — S6991XA Unspecified injury of right wrist, hand and finger(s), initial encounter: Secondary | ICD-10-CM | POA: Diagnosis not present

## 2021-04-16 DIAGNOSIS — Z8616 Personal history of COVID-19: Secondary | ICD-10-CM | POA: Diagnosis not present

## 2021-04-16 DIAGNOSIS — W228XXA Striking against or struck by other objects, initial encounter: Secondary | ICD-10-CM | POA: Diagnosis not present

## 2021-04-16 HISTORY — DX: COVID-19: U07.1

## 2021-04-16 LAB — COMPREHENSIVE METABOLIC PANEL
ALT: 23 U/L (ref 0–44)
AST: 27 U/L (ref 15–41)
Albumin: 4.3 g/dL (ref 3.5–5.0)
Alkaline Phosphatase: 70 U/L (ref 38–126)
Anion gap: 7 (ref 5–15)
BUN: 14 mg/dL (ref 6–20)
CO2: 26 mmol/L (ref 22–32)
Calcium: 9.2 mg/dL (ref 8.9–10.3)
Chloride: 100 mmol/L (ref 98–111)
Creatinine, Ser: 1 mg/dL (ref 0.61–1.24)
GFR, Estimated: >60 mL/min (ref >60)
Glucose, Bld: 105 mg/dL — ABNORMAL HIGH (ref 70–99)
Potassium: 4.2 mmol/L (ref 3.5–5.1)
Sodium: 133 mmol/L — ABNORMAL LOW (ref 135–145)
Total Bilirubin: 0.5 mg/dL (ref 0.3–1.2)
Total Protein: 7.2 g/dL (ref 6.5–8.1)

## 2021-04-16 LAB — CBC WITH DIFFERENTIAL/PLATELET
Abs Immature Granulocytes: 0.02 10*3/uL (ref 0.00–0.07)
Basophils Absolute: 0 10*3/uL (ref 0.0–0.1)
Basophils Relative: 0 %
Eosinophils Absolute: 0 10*3/uL (ref 0.0–0.5)
Eosinophils Relative: 0 %
HCT: 42.6 % (ref 39.0–52.0)
Hemoglobin: 14.5 g/dL (ref 13.0–17.0)
Immature Granulocytes: 0 %
Lymphocytes Relative: 13 %
Lymphs Abs: 1.3 10*3/uL (ref 0.7–4.0)
MCH: 31.6 pg (ref 26.0–34.0)
MCHC: 34 g/dL (ref 30.0–36.0)
MCV: 92.8 fL (ref 80.0–100.0)
Monocytes Absolute: 0.9 10*3/uL (ref 0.1–1.0)
Monocytes Relative: 9 %
Neutro Abs: 7.3 10*3/uL (ref 1.7–7.7)
Neutrophils Relative %: 78 %
Platelets: 127 10*3/uL — ABNORMAL LOW (ref 150–400)
RBC: 4.59 MIL/uL (ref 4.22–5.81)
RDW: 13.5 % (ref 11.5–15.5)
WBC: 9.5 10*3/uL (ref 4.0–10.5)
nRBC: 0 % (ref 0.0–0.2)

## 2021-04-16 LAB — PROTIME-INR
INR: 1 (ref 0.8–1.2)
Prothrombin Time: 12.7 seconds (ref 11.4–15.2)

## 2021-04-16 MED ORDER — NIRMATRELVIR/RITONAVIR (PAXLOVID)TABLET: 3.0000 | ORAL_TABLET | Freq: Two times a day (BID) | ORAL | 0 refills | Status: AC

## 2021-04-16 MED ORDER — ACETAMINOPHEN 325 MG PO TABS
650.0000 mg | ORAL_TABLET | Freq: Once | ORAL | Status: AC | PRN
  Administered 2021-04-16: 15:00:00 650 mg via ORAL
  Filled 2021-04-16: qty 2

## 2021-04-16 MED ORDER — METHOCARBAMOL 500 MG PO TABS: 500.0000 mg | ORAL_TABLET | Freq: Three times a day (TID) | ORAL | 0 refills | Status: DC | PRN

## 2021-04-16 MED ORDER — IOHEXOL 350 MG/ML SOLN
75.0000 mL | Freq: Once | INTRAVENOUS | Status: AC | PRN
  Administered 2021-04-16: 17:00:00 75 mL via INTRAVENOUS

## 2021-04-16 NOTE — Progress Notes (Signed)
Virtual Visit Consent   Arthor Gorter, you are scheduled for a virtual visit with a [facility] provider today.     Just as with appointments in the office, your consent must be obtained to participate.  Your consent will be active for this visit and any virtual visit you may have with one of our providers in the next 365 days.     If you have a MyChart account, a copy of this consent can be sent to you electronically.  All virtual visits are billed to your insurance company just like a traditional visit in the office.    As this is a virtual visit, video technology does not allow for your provider to perform a traditional examination.  This may limit your provider's ability to fully assess your condition.  If your provider identifies any concerns that need to be evaluated in person or the need to arrange testing (such as labs, EKG, etc.), we will make arrangements to do so.     Although advances in technology are sophisticated, we cannot ensure that it will always work on either your end or our end.  If the connection with a video visit is poor, the visit may have to be switched to a telephone visit.  With either a video or telephone visit, we are not always able to ensure that we have a secure connection.     I need to obtain your verbal consent now.   Are you willing to proceed with your visit today? Yes   Joziyah Roblero has provided verbal consent on 04/16/2021 for a virtual visit (video or telephone).   Lisette Abu, PA-C   Date: 04/16/2021 12:44 PM   Virtual Visit via Video Note   Laural Roes, connected [email or web address]@ ([id number], 03/04/1961) on 04/16/21 at 12:30 PM EDT by a video-enabled telemedicine application and verified that I am speaking with the correct person using two identifiers.  Location: Patient: Virtual Visit Location Patient: Home Provider: Virtual Visit Location Provider: Home Office   I discussed the limitations of evaluation and  management by telemedicine and the availability of in person appointments. The patient expressed understanding and agreed to proceed.    History of Present Illness: Ralph Phillips is a 60 y.o. who identifies as a male who was assigned male at birth, and is being seen today for Covid. He tested positive last night after returning from work trip in [place]. His symptoms began Thursday night. He has body aches, congestion, fevers, vertigo, and SOB. He is vaccinated.  HPI: HPI  Problems:  Patient Active Problem List   Diagnosis Date Noted   Carpal tunnel syndrome of left wrist 06/18/2018   Gastroesophageal reflux disease without esophagitis 04/03/2018   Esophageal dysphagia 04/03/2018   Mixed hyperlipidemia 04/03/2018   Carpal tunnel syndrome of right wrist 01/09/2018   Diverticulosis 12/11/2017   Obesity 09/20/2017   Cervical radiculopathy 04/16/2017   GAD (generalized anxiety disorder) 01/01/2017   Insomnia 01/01/2017   Chronic allergic rhinitis 01/01/2017   Hypogonadism male 09/09/2013   Prediabetes 09/09/2013    Allergies: No Known Allergies Medications:  Current Outpatient Medications:    fluticasone (FLONASE) 50 MCG/ACT nasal spray, PLACE 1 SPRAY INTO BOTH NOSTRILS DAILY, Disp: 48 mL, Rfl: 1   Glucosamine-Chondroitin (GLUCOSAMINE CHONDR COMPLEX PO), Take by mouth daily. , Disp: , Rfl:    IBUPROFEN PO, Take by mouth as needed., Disp: , Rfl:    levocetirizine (XYZAL) 5 MG tablet, TAKE 1 TABLET BY MOUTH EVERY DAY IN  THE EVENING, Disp: 90 tablet, Rfl: 3   Melatonin 10 MG TABS, Take by mouth 2 (two) times daily., Disp: , Rfl:    Misc Natural Products (CORTISOL PO), Take by mouth., Disp: , Rfl:    Misc Natural Products (PROSTATE SUPPORT PO), Take by mouth., Disp: , Rfl:    Multiple Vitamin (MULTIVITAMIN WITH MINERALS) TABS tablet, Take 1 tablet by mouth daily. Miracle 2000, Disp: , Rfl:    OVER THE COUNTER MEDICATION, Take 1 capsule by mouth 2 (two) times daily. Prosvent dietary  supplement - Vitamin D3, Zinc, Black Pepper, Nettle Root, Sterol Esters, Pumpkin Seed, Saw Palmetto, Pygeum Africanum and Lycopene, Disp: , Rfl:    PARoxetine (PAXIL) 20 MG tablet, TAKE 1 TABLET BY MOUTH EVERY DAY, Disp: 90 tablet, Rfl: 1   prednisoLONE (PRELONE) 15 MG/5ML SOLN, Take by mouth daily before breakfast., Disp: , Rfl:    simvastatin (ZOCOR) 20 MG tablet, TAKE 1 TABLET BY MOUTH EVERYDAY AT BEDTIME, Disp: 90 tablet, Rfl: 3   traZODone (DESYREL) 50 MG tablet, Take 2-3 tablets (100-150 mg total) by mouth at bedtime as needed for sleep., Disp: 180 tablet, Rfl: 3  Observations/Objective: Patient is well-developed, well-nourished in no acute distress.  Resting at home, appears ill. Head is normocephalic, atraumatic.  No labored breathing.  Speech is clear and coherent with logical content.  Patient is alert and oriented at baseline.    Assessment and Plan: 1. COVID Given severity of symptoms I suggested the patient be evaluated in one of our ED's to see if he meets criteria for admission.  Follow Up Instructions: I discussed the assessment and treatment plan with the patient. The patient was provided an opportunity to ask questions and all were answered. The patient agreed with the plan and demonstrated an understanding of the instructions.  A copy of instructions were sent to the patient via MyChart.  The patient was advised to call back or seek an in-person evaluation if the symptoms worsen or if the condition fails to improve as anticipated.  Time:  I spent 15 minutes with the patient via telehealth technology discussing the above problems/concerns.    Lisette Abu, PA-C

## 2021-04-16 NOTE — Discharge Instructions (Addendum)
Stop your simvastatin until 5 days after you finish the Paxlovid.  Follow-up with the orthopedic surgeon for your back.  They can also see you for your finger injury.  The muscle laxer may help with the back pain.  Watch for worsening trouble breathing
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
## 2021-04-16 NOTE — ED Triage Notes (Addendum)
Pt tested + Covid at home. Coughing brown sputum which he feels is blood. Works in [place], Exposed to various people on plane and in [place]. Nasal drainage with body pain. Rt 5th finger injury, would like to have it evaluated. Back pain with swollen lumbar disc with numbness in feet. Pt states he is have black watery stools with foul odor.

## 2021-04-20 DIAGNOSIS — U071 COVID-19: Secondary | ICD-10-CM | POA: Diagnosis not present

## 2021-05-12 DIAGNOSIS — M5416 Radiculopathy, lumbar region: Secondary | ICD-10-CM | POA: Diagnosis not present

## 2021-05-20 ENCOUNTER — Other Ambulatory Visit: Payer: Self-pay | Admitting: Family Medicine

## 2021-05-25 DIAGNOSIS — M1711 Unilateral primary osteoarthritis, right knee: Secondary | ICD-10-CM | POA: Diagnosis not present

## 2021-05-31 DIAGNOSIS — M5416 Radiculopathy, lumbar region: Secondary | ICD-10-CM | POA: Diagnosis not present

## 2021-06-01 DIAGNOSIS — M1711 Unilateral primary osteoarthritis, right knee: Secondary | ICD-10-CM | POA: Diagnosis not present

## 2021-06-09 ENCOUNTER — Encounter: Payer: BC Managed Care – PPO | Admitting: Family Medicine

## 2021-06-10 DIAGNOSIS — M1711 Unilateral primary osteoarthritis, right knee: Secondary | ICD-10-CM | POA: Diagnosis not present

## 2021-06-13 ENCOUNTER — Encounter: Payer: BC Managed Care – PPO | Admitting: Family Medicine

## 2021-06-14 ENCOUNTER — Encounter: Payer: Self-pay | Admitting: Family Medicine

## 2021-06-14 ENCOUNTER — Ambulatory Visit (INDEPENDENT_AMBULATORY_CARE_PROVIDER_SITE_OTHER): Payer: BC Managed Care – PPO | Admitting: Family Medicine

## 2021-06-14 VITALS — BP 118/82 | HR 72 | Temp 97.5°F | Wt 196.4 lb

## 2021-06-14 DIAGNOSIS — Z0000 Encounter for general adult medical examination without abnormal findings: Secondary | ICD-10-CM

## 2021-06-14 DIAGNOSIS — E782 Mixed hyperlipidemia: Secondary | ICD-10-CM | POA: Diagnosis not present

## 2021-06-14 DIAGNOSIS — R7303 Prediabetes: Secondary | ICD-10-CM | POA: Diagnosis not present

## 2021-06-14 DIAGNOSIS — K219 Gastro-esophageal reflux disease without esophagitis: Secondary | ICD-10-CM

## 2021-06-14 DIAGNOSIS — R1319 Other dysphagia: Secondary | ICD-10-CM

## 2021-06-14 DIAGNOSIS — E559 Vitamin D deficiency, unspecified: Secondary | ICD-10-CM

## 2021-06-14 DIAGNOSIS — E291 Testicular hypofunction: Secondary | ICD-10-CM

## 2021-06-14 DIAGNOSIS — Z23 Encounter for immunization: Secondary | ICD-10-CM

## 2021-06-14 DIAGNOSIS — F5101 Primary insomnia: Secondary | ICD-10-CM

## 2021-06-14 DIAGNOSIS — F411 Generalized anxiety disorder: Secondary | ICD-10-CM | POA: Diagnosis not present

## 2021-06-14 LAB — CBC WITH DIFFERENTIAL/PLATELET
Basophils Absolute: 0 10*3/uL (ref 0.0–0.1)
Basophils Relative: 0.8 % (ref 0.0–3.0)
Eosinophils Absolute: 0.1 10*3/uL (ref 0.0–0.7)
Eosinophils Relative: 2.3 % (ref 0.0–5.0)
HCT: 44.1 % (ref 39.0–52.0)
Hemoglobin: 14.6 g/dL (ref 13.0–17.0)
Lymphocytes Relative: 40.2 % (ref 12.0–46.0)
Lymphs Abs: 1.9 10*3/uL (ref 0.7–4.0)
MCHC: 33 g/dL (ref 30.0–36.0)
MCV: 93.9 fl (ref 78.0–100.0)
Monocytes Absolute: 0.4 10*3/uL (ref 0.1–1.0)
Monocytes Relative: 8 % (ref 3.0–12.0)
Neutro Abs: 2.3 10*3/uL (ref 1.4–7.7)
Neutrophils Relative %: 48.7 % (ref 43.0–77.0)
Platelets: 142 10*3/uL — ABNORMAL LOW (ref 150.0–400.0)
RBC: 4.69 Mil/uL (ref 4.22–5.81)
RDW: 13.9 % (ref 11.5–15.5)
WBC: 4.8 10*3/uL (ref 4.0–10.5)

## 2021-06-14 LAB — VITAMIN D 25 HYDROXY (VIT D DEFICIENCY, FRACTURES): VITD: 22.95 ng/mL — ABNORMAL LOW (ref 30.00–100.00)

## 2021-06-14 LAB — COMPREHENSIVE METABOLIC PANEL
ALT: 17 U/L (ref 0–53)
AST: 18 U/L (ref 0–37)
Albumin: 4.2 g/dL (ref 3.5–5.2)
Alkaline Phosphatase: 67 U/L (ref 39–117)
BUN: 19 mg/dL (ref 6–23)
CO2: 30 mEq/L (ref 19–32)
Calcium: 9.5 mg/dL (ref 8.4–10.5)
Chloride: 101 mEq/L (ref 96–112)
Creatinine, Ser: 1.1 mg/dL (ref 0.40–1.50)
GFR: 74.18 mL/min (ref >60.00)
Glucose, Bld: 88 mg/dL (ref 70–99)
Potassium: 4.5 mEq/L (ref 3.5–5.1)
Sodium: 138 mEq/L (ref 135–145)
Total Bilirubin: 0.5 mg/dL (ref 0.2–1.2)
Total Protein: 7.3 g/dL (ref 6.0–8.3)

## 2021-06-14 LAB — LIPID PANEL
Cholesterol: 190 mg/dL (ref 0–200)
HDL: 45.2 mg/dL (ref >39.00)
LDL Cholesterol: 112 mg/dL — ABNORMAL HIGH (ref 0–99)
NonHDL: 144.64
Total CHOL/HDL Ratio: 4
Triglycerides: 162 mg/dL — ABNORMAL HIGH (ref 0.0–149.0)
VLDL: 32.4 mg/dL (ref 0.0–40.0)

## 2021-06-14 LAB — TSH: TSH: 1.43 u[IU]/mL (ref 0.35–5.50)

## 2021-06-14 LAB — HEMOGLOBIN A1C: Hgb A1c MFr Bld: 6.2 % (ref 4.6–6.5)

## 2021-06-14 NOTE — Progress Notes (Signed)
Subjective  Chief Complaint  Patient presents with   Annual Exam    Fasting     HPI: Ralph Phillips is a 60 y.o. male who presents to [facility] today for a Male Wellness Visit. He also has the concerns and/or needs as listed above in the chief complaint. These will be addressed in addition to the Health Maintenance Visit.   Wellness Visit: annual visit with health maintenance review and exam   Health maintenance: Screens are up-to-date.  Overall doing very well.  Feeling well.  Eligible for Shingrix vaccination.  Body mass index is 30.76 kg/m. Wt Readings from Last 3 Encounters:  06/14/21 196 lb 6.4 oz (89.1 kg)  04/16/21 197 lb (89.4 kg)  11/11/20 203 lb 3.2 oz (92.2 kg)     Chronic disease management visit and/or acute problem visit: Mixed hyperlipidemia treated with a statin.  Tolerates well.  Due for recheck.  Weight is stable.  Eating well. History of GERD and esophageal dysphagia: No longer having significant symptoms.  Believes it had been exacerbated by some over-the-counter supplements.  No melena. History of prediabetes: Tries to eat a healthy diet.  No symptoms of hyperglycemia Insomnia: Now well controlled with trazodone 50 to 100 mg nightly and combination with 1 melatonin.  Sleeping very well.  No adverse effects. Generalized anxiety disorder this medical condition is well controlled. There are no signs of complications, medication side effects, or red flags. Patient is instructed to continue the current treatment plan without change in therapies or medications.  On Paxil 20 mg daily.  Has been stable for the last year. History of vitamin D deficiency: Takes over-the-counter supplements.  Due for recheck. Hypogonadism: Managed by urology.  Reports this is stable.  Patient Active Problem List   Diagnosis Date Noted   Mixed hyperlipidemia 04/03/2018   Cervical radiculopathy 04/16/2017   GAD (generalized anxiety disorder)  01/01/2017   Gastroesophageal reflux disease without esophagitis 04/03/2018   Esophageal dysphagia 04/03/2018   Diverticulosis 12/11/2017   Obesity 09/20/2017   Insomnia 01/01/2017   Hypogonadism male 09/09/2013   Prediabetes 09/09/2013   Carpal tunnel syndrome of left wrist 06/18/2018   Carpal tunnel syndrome of right wrist 01/09/2018   Chronic allergic rhinitis 01/01/2017   Health Maintenance  Topic Date Due   Zoster Vaccines- Shingrix (2 of 2) 08/09/2021   TETANUS/TDAP  01/29/2024   COLONOSCOPY (Pts 45-67yr Insurance coverage will need to be confirmed)  06/24/2024   COVID-19 Vaccine  Completed   Hepatitis C Screening  Addressed   HIV Screening  Addressed   Pneumococcal Vaccine [id number] old  Aged Out   HPV VACCINES  Aged Out   INFLUENZA VACCINE  Discontinued   Immunization History  Administered Date(s) Administered   Influenza Split 08/16/2012   Influenza,inj,Quad PF,6+ Mos 07/27/2015, 07/27/2016   PFIZER(Purple Top)SARS-COV-2 Vaccination 01/02/2020, 04/21/2020, 05/22/2020   Tdap 01/28/2014   Zoster Recombinat (Shingrix) 06/14/2021   We updated and reviewed the patient's past history in detail and it is documented below. Allergies: Patient has No Known Allergies. Past Medical History  has a past medical history of Allergy, Blood transfusion without reported diagnosis (1994), Carpal tunnel syndrome, COVID-19, Diverticulosis (12/11/2017), GAD (generalized anxiety disorder), GERD (gastroesophageal reflux disease), Heart murmur, Hyperlipidemia, Lower urinary tract symptoms (LUTS), Medial meniscus tear (09/11/2012), Obesity, Pre-diabetes, and Seizures ([facility]. Past Surgical History Patient  has a past surgical history that includes Appendectomy (1974); Wrist surgery (1994); buttock surgery (1986); Shoulder surgery (1991); Facial reconstruction  surgery (1968); Biceps tendon repair (2018); Carpal tunnel release (Right); Torn Meniscus (Right, 05/24/2020); and Eye surgery  (08/2020). Social History Patient  reports that he quit smoking about 19 years ago. His smoking use included cigarettes. He has a 5.00 pack-year smoking history. He has never used smokeless tobacco. He reports current alcohol use. He reports that he does not use drugs. Family History family history includes Alzheimer's disease in his father; Arthritis in his maternal grandmother and mother; Colon cancer in his maternal uncle; Colon polyps (age of onset: 85) in his father; Heart disease in his paternal grandmother; Hyperlipidemia in his father; Parkinson's disease in his mother; Stroke in his maternal grandfather. Review of Systems: Constitutional: negative for fever or malaise Ophthalmic: negative for photophobia, double vision or loss of vision Cardiovascular: negative for chest pain, dyspnea on exertion, or new LE swelling Respiratory: negative for SOB or persistent cough Gastrointestinal: negative for abdominal pain, change in bowel habits or melena Genitourinary: negative for dysuria or gross hematuria Musculoskeletal: negative for new gait disturbance or muscular weakness Integumentary: negative for new or persistent rashes Neurological: negative for TIA or stroke symptoms Psychiatric: negative for SI or delusions Allergic/Immunologic: negative for hives  Patient Care Team    Relationship Specialty Notifications Start End  Leamon Arnt, MD PCP - General Family Medicine  12/11/17   Roseanne Kaufman, MD Consulting Physician Orthopedic Surgery  12/11/17   Suella Broad, MD Consulting Physician Physical Medicine and Rehabilitation  12/11/17   Ardis Hughs, MD Attending Physician Urology  12/11/17   Irene Shipper, MD Consulting Physician Gastroenterology  12/11/17   Meredith Pel, MD Consulting Physician Orthopedic Surgery  06/07/20    Objective  Vitals: BP 118/82   Pulse 72   Temp (!) 97.5 F (36.4 C) (Temporal)   Wt 196 lb 6.4 oz (89.1 kg)   SpO2 98%   BMI 30.76 kg/m   General:  Well developed, well nourished, no acute distress  Psych:  Alert and orientedx3,normal mood and affect HEENT:  Normocephalic, atraumatic, non-icteric sclera, PERRL, oropharynx is clear without mass or exudate, supple neck without adenopathy, mass or thyromegaly Cardiovascular:  Normal S1, S2, RRR without gallop, rub or murmur, nondisplaced PMI, +2 distal pulses in bilateral upper and lower extremities. Respiratory:  Good breath sounds bilaterally, CTAB with normal respiratory effort Gastrointestinal: normal bowel sounds, soft, non-tender, no noted masses. No HSM MSK: no deformities, contusions. Joints are without erythema or swelling. Spine and CVA region are nontender Skin:  Warm, no rashes or suspicious lesions noted Neurologic:    Mental status is normal. CN 2-11 are normal. Gross motor and sensory exams are normal. Stable gait. No tremor GU: No inguinal hernias or adenopathy are appreciated bilaterally   Assessment  1. Annual physical exam   2. Prediabetes   3. Mixed hyperlipidemia   4. GAD (generalized anxiety disorder)   5. Gastroesophageal reflux disease without esophagitis   6. Esophageal dysphagia   7. Primary insomnia   8. Vitamin D deficiency   9. Hypogonadism male      Plan  Male Wellness Visit: Age appropriate Health Maintenance and Prevention measures were discussed with patient. Included topics are cancer screening recommendations, ways to keep healthy (see AVS) including dietary and exercise recommendations, regular eye and dental care, use of seat belts, and avoidance of moderate alcohol use and tobacco use.  Screens are up-to-date BMI: discussed patient's BMI and encouraged positive lifestyle modifications to help get to or maintain a target BMI. HM needs  and immunizations were addressed and ordered. See below for orders. See HM and immunization section for updates.  First Shingrix vaccination given today Routine labs and screening tests ordered including  cmp, cbc and lipids where appropriate. Discussed recommendations regarding Vit D and calcium supplementation (see AVS)  Chronic disease f/u and/or acute problem visit: (deemed necessary to be done in addition to the wellness visit): Prediabetes: Recheck A1c.  Recommend low sugar diet. Hyperlipidemia on pravastatin 20 mg nightly.  Continue.  Check levels.  Tolerates well.  Recommend low-fat diet. Insomnia is well controlled on trazodone 50 to 100 mg nightly with melatonin 5 mg. Paxil 20 mg daily for generalized anxiety disorder remains well controlled Recheck vitamin D levels.  Follow up: 12 months for complete physical Commons side effects, risks, benefits, and alternatives for medications and treatment plan prescribed today were discussed, and the patient expressed understanding of the given instructions. Patient is instructed to call or message via MyChart if he/she has any questions or concerns regarding our treatment plan. No barriers to understanding were identified. We discussed Red Flag symptoms and signs in detail. Patient expressed understanding regarding what to do in case of urgent or emergency type symptoms.  Medication list was reconciled, printed and provided to the patient in AVS. Patient instructions and summary information was reviewed with the patient as documented in the AVS. This note was prepared with assistance of Dragon voice recognition software. Occasional wrong-word or sound-a-like substitutions may have occurred due to the inherent limitations of voice recognition software  This visit occurred during the SARS-CoV-2 public health emergency.  Safety protocols were in place, including screening questions prior to the visit, additional usage of staff PPE, and extensive cleaning of exam room while observing appropriate contact time as indicated for disinfecting solutions.   Orders Placed This Encounter  Procedures   Varicella-zoster vaccine IM (Shingrix)   TSH   Lipid panel    CBC with Differential/Platelet   Comprehensive metabolic panel   Hemoglobin A1c   VITAMIN D 25 Hydroxy (Vit-D Deficiency, Fractures)   No orders of the defined types were placed in this encounter.

## 2021-06-14 NOTE — Patient Instructions (Signed)
Please return in 6 months to recheck thyroid and sugars and receive your 2nd shingrix vaccination.  I will release your lab results to you on your MyChart account with further instructions. Please reply with any questions.    Today you were given your 1st of 2 Shingrix vaccination.    If you have any questions or concerns, please don't hesitate to send me a message via MyChart or call the office at [phone]. Thank you for visiting with [place] today! It's our pleasure caring for you.

## 2021-06-21 DIAGNOSIS — M5416 Radiculopathy, lumbar region: Secondary | ICD-10-CM | POA: Diagnosis not present

## 2021-06-21 DIAGNOSIS — G894 Chronic pain syndrome: Secondary | ICD-10-CM | POA: Diagnosis not present

## 2021-07-01 DIAGNOSIS — R7301 Impaired fasting glucose: Secondary | ICD-10-CM | POA: Diagnosis not present

## 2021-07-01 DIAGNOSIS — E7800 Pure hypercholesterolemia, unspecified: Secondary | ICD-10-CM | POA: Diagnosis not present

## 2021-07-01 DIAGNOSIS — E039 Hypothyroidism, unspecified: Secondary | ICD-10-CM | POA: Diagnosis not present

## 2021-07-01 DIAGNOSIS — Z23 Encounter for immunization: Secondary | ICD-10-CM | POA: Diagnosis not present

## 2021-07-01 DIAGNOSIS — E010 Iodine-deficiency related diffuse (endemic) goiter: Secondary | ICD-10-CM | POA: Diagnosis not present

## 2021-07-12 ENCOUNTER — Other Ambulatory Visit: Payer: Self-pay | Admitting: Family Medicine

## 2021-08-05 ENCOUNTER — Other Ambulatory Visit: Payer: Self-pay | Admitting: Family Medicine

## 2021-11-02 ENCOUNTER — Other Ambulatory Visit: Payer: Self-pay | Admitting: Family Medicine

## 2021-11-06 ENCOUNTER — Other Ambulatory Visit: Payer: Self-pay | Admitting: Family Medicine

## 2021-11-08 ENCOUNTER — Other Ambulatory Visit: Payer: Self-pay

## 2021-11-08 MED ORDER — PAROXETINE HCL 20 MG PO TABS: 20.0000 mg | ORAL_TABLET | Freq: Every day | ORAL | 1 refills | Status: DC

## 2021-11-25 ENCOUNTER — Other Ambulatory Visit: Payer: Self-pay | Admitting: Family Medicine

## 2021-12-16 ENCOUNTER — Ambulatory Visit: Payer: BC Managed Care – PPO | Admitting: Family Medicine

## 2021-12-31 IMAGING — DX DG FINGER LITTLE 2+V*R*
1 series · 3 of 3 positions shown · non-contrast
Comparison: None.

CLINICAL DATA: Right small finger injury.

EXAM:
RIGHT LITTLE FINGER 2+V

[Series 1: finger · 0.14mm/px · 3 of 3 slices shown]
[im 1/3]
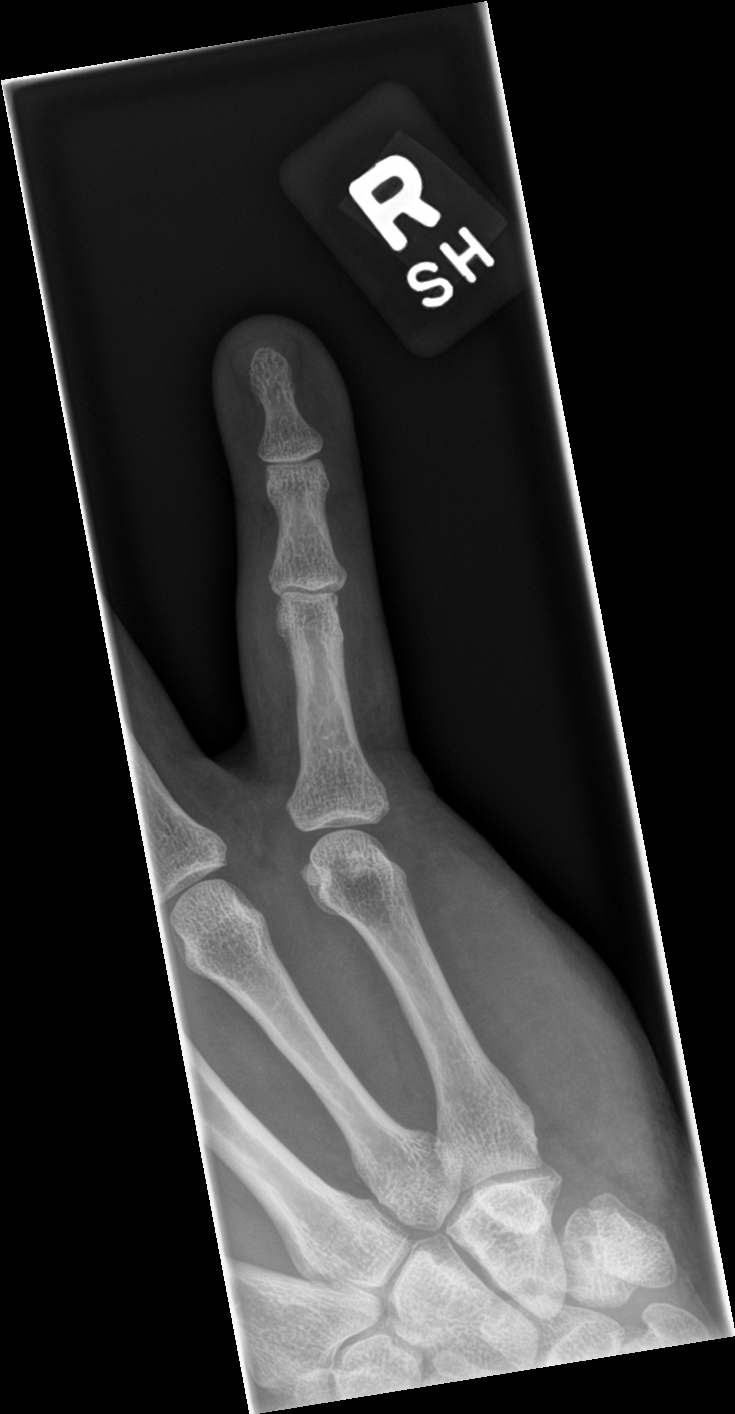
[im 2/3]
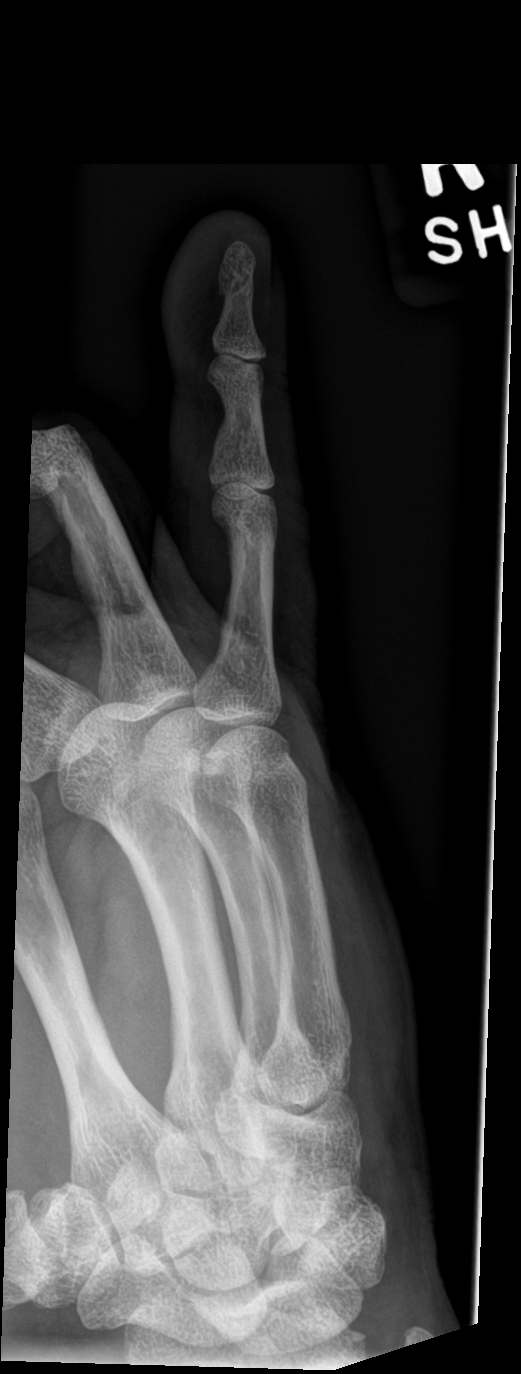
[im 3/3]
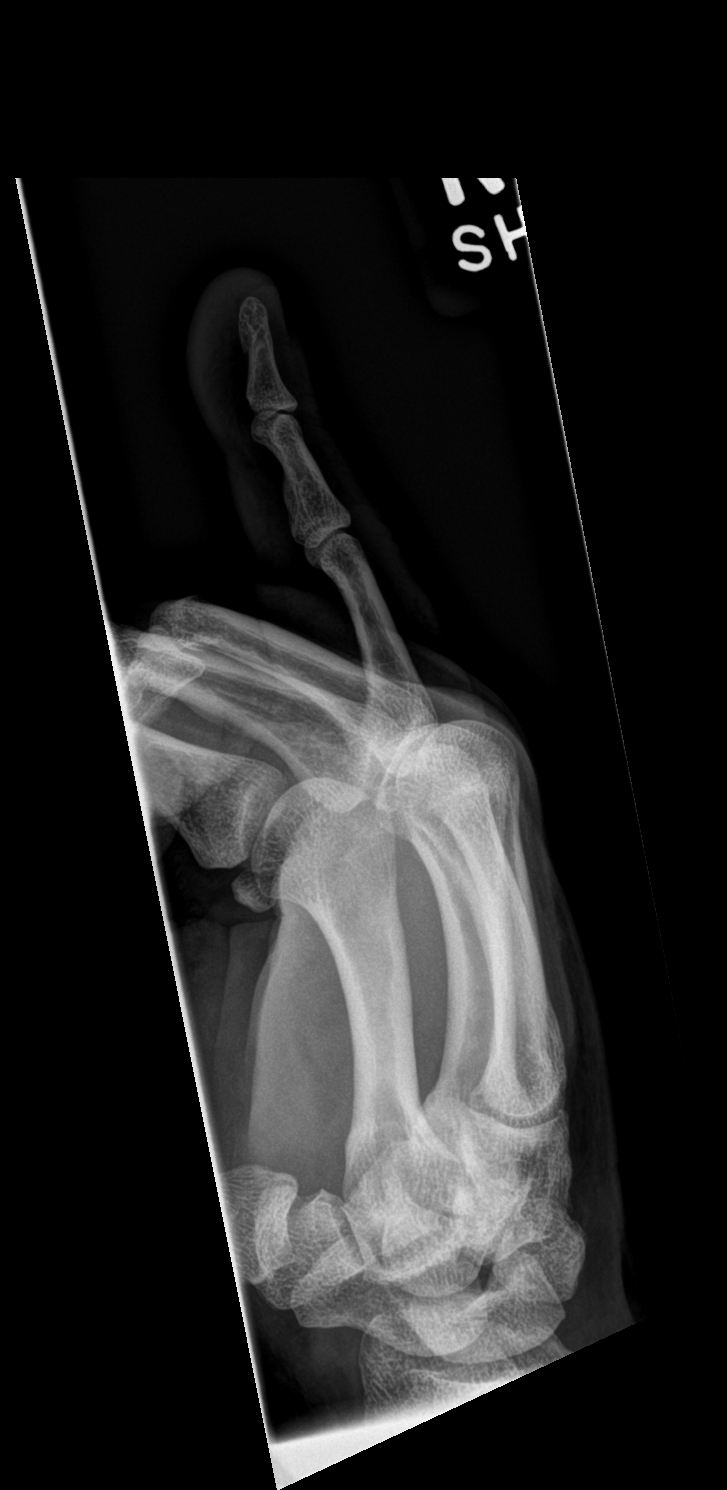

[3 of 3 positions shown; findings below may reference images not displayed]

FINDINGS: There is no evidence of fracture or dislocation. There is no
evidence of arthropathy or other focal bone abnormality. Soft
tissues are unremarkable.
IMPRESSION: Negative.

## 2021-12-31 IMAGING — DX DG CHEST 1V PORT
1 series · 1 of 1 positions shown · non-contrast
Comparison: Chest x-ray dated November 05, 2017.

CLINICAL DATA: Hemoptysis.  COVID positive.

EXAM:
PORTABLE CHEST 1 VIEW

[chest]
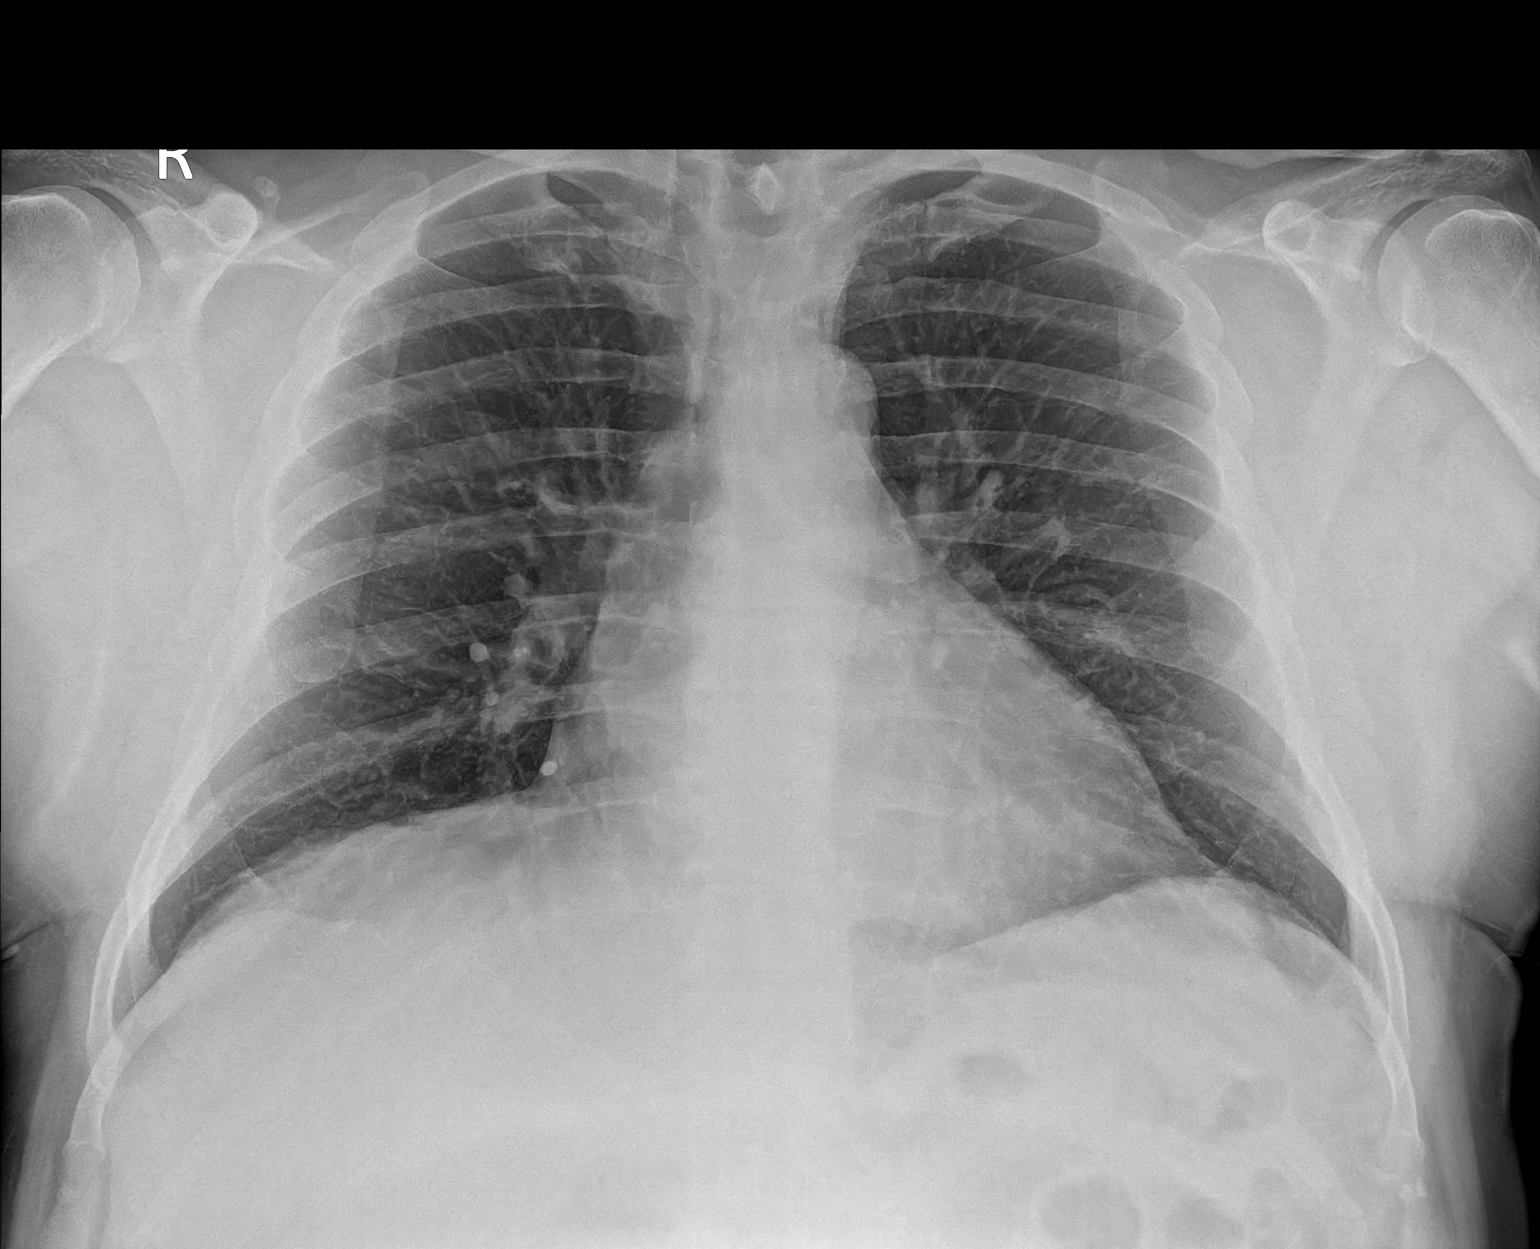

[1 of 1 positions shown; findings below may reference images not displayed]

FINDINGS: The heart size and mediastinal contours are within normal limits.
Both lungs are clear. The visualized skeletal structures are
unremarkable.
IMPRESSION: No active disease.

## 2022-04-26 ENCOUNTER — Ambulatory Visit (HOSPITAL_BASED_OUTPATIENT_CLINIC_OR_DEPARTMENT_OTHER): Payer: Commercial Managed Care - HMO | Admitting: Family Medicine

## 2022-04-26 ENCOUNTER — Encounter (HOSPITAL_BASED_OUTPATIENT_CLINIC_OR_DEPARTMENT_OTHER): Payer: Self-pay | Admitting: Family Medicine

## 2022-04-26 DIAGNOSIS — R1319 Other dysphagia: Secondary | ICD-10-CM

## 2022-04-26 DIAGNOSIS — K219 Gastro-esophageal reflux disease without esophagitis: Secondary | ICD-10-CM

## 2022-04-26 DIAGNOSIS — E782 Mixed hyperlipidemia: Secondary | ICD-10-CM

## 2022-04-26 DIAGNOSIS — R0681 Apnea, not elsewhere classified: Secondary | ICD-10-CM

## 2022-04-26 DIAGNOSIS — F5101 Primary insomnia: Secondary | ICD-10-CM

## 2022-04-26 DIAGNOSIS — R7303 Prediabetes: Secondary | ICD-10-CM | POA: Diagnosis not present

## 2022-04-26 DIAGNOSIS — Z0000 Encounter for general adult medical examination without abnormal findings: Secondary | ICD-10-CM

## 2022-04-26 DIAGNOSIS — Z8249 Family history of ischemic heart disease and other diseases of the circulatory system: Secondary | ICD-10-CM | POA: Insufficient documentation

## 2022-04-26 NOTE — Assessment & Plan Note (Signed)
Related to his family history of heart disease as well as personal history of hyperlipidemia, chest pains (which are felt to be related to GI etiology), patient would like evaluation with cardiology.  Referral placed today

## 2022-04-26 NOTE — Assessment & Plan Note (Signed)
Has been managing with trazodone which generally has helped with symptoms.  Did discuss alternative treatment options including CBT, he would be interested in referral to arrange for cognitive behavioral therapy, referral placed today

## 2022-04-26 NOTE — Assessment & Plan Note (Signed)
Noted previously, has been taking simvastatin 20 mg daily, tolerating medication He also reports a family history of heart disease and would like further evaluation with cardiology.  Feel that this would be reasonable, referral placed today

## 2022-04-26 NOTE — Assessment & Plan Note (Signed)
As above, patient has been noticing increasing issues with dysphagia and would like to have further evaluation with GI, requesting referral to alternative provider from who he had seen in the past in order to get a second opinion
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
## 2022-04-26 NOTE — Assessment & Plan Note (Signed)
Has seen GI in the past, has been utilizing omeprazole to help with symptoms.  He does report that he has been having increasing issues with dysphagia.  Would like referral to new GI physician for further evaluation, second opinion from provider who he has seen a few years ago Referral to GI placed today

## 2022-04-26 NOTE — Patient Instructions (Signed)
  Medication Instructions:  Your physician recommends that you continue on your current medications as directed. Please refer to the Current Medication list given to you today. --If you need a refill on any your medications before your next appointment, please call your pharmacy first. If no refills are authorized on file call the office.-- Lab Work: Your physician has recommended that you have lab work today: Yes If you have labs (blood work) drawn today and your tests are completely normal, you will receive your results via MyChart message OR a phone call from our staff.  Please ensure you check your voicemail in the event that you authorized detailed messages to be left on a delegated number. If you have any lab test that is abnormal or we need to change your treatment, we will call you to review the results.  Referrals/Procedures/Imaging: No  Follow-Up: Your next appointment:   Your physician recommends that you schedule a follow-up appointment in: 6-8 weeks with Dr. de [place].  You will receive a text message or e-mail with a link to a survey about your care and experience with us today! We would greatly appreciate your feedback!   Thanks for letting us be apart of your health journey!!  Primary Care and Sports Medicine   Dr. Raymond de Cuba   We encourage you to activate your patient portal called "MyChart".  Sign up information is provided on this After Visit Summary.  MyChart is used to connect with patients for Virtual Visits (Telemedicine).  Patients are able to view lab/test results, encounter notes, upcoming appointments, etc.  Non-urgent messages can be sent to your provider as well. To learn more about what you can do with MyChart, please visit --  [email or web address].    

## 2022-04-26 NOTE — Assessment & Plan Note (Signed)
Noted on prior labs in the past, has been managing with lifestyle modifications We will plan to recheck A1c with upcoming labs for physical

## 2022-04-27 LAB — COMPREHENSIVE METABOLIC PANEL
ALT: 20 IU/L (ref 0–44)
AST: 16 IU/L (ref 0–40)
Albumin/Globulin Ratio: 1.9 (ref 1.2–2.2)
Albumin: 4.4 g/dL (ref 3.8–4.9)
Alkaline Phosphatase: 71 IU/L (ref 44–121)
BUN/Creatinine Ratio: 13 (ref 9–20)
BUN: 13 mg/dL (ref 6–24)
Bilirubin Total: 0.3 mg/dL (ref 0.0–1.2)
CO2: 26 mmol/L (ref 20–29)
Calcium: 9.2 mg/dL (ref 8.7–10.2)
Chloride: 100 mmol/L (ref 96–106)
Creatinine, Ser: 1.02 mg/dL (ref 0.76–1.27)
Globulin, Total: 2.3 g/dL (ref 1.5–4.5)
Glucose: 105 mg/dL — ABNORMAL HIGH (ref 70–99)
Potassium: 4.6 mmol/L (ref 3.5–5.2)
Sodium: 136 mmol/L (ref 134–144)
Total Protein: 6.7 g/dL (ref 6.0–8.5)
eGFR: 85 mL/min/{1.73_m2} (ref >59)

## 2022-04-27 LAB — LIPID PANEL
Chol/HDL Ratio: 3.9 ratio (ref 0.0–5.0)
Cholesterol, Total: 205 mg/dL — ABNORMAL HIGH (ref 100–199)
HDL: 53 mg/dL (ref >39)
LDL Chol Calc (NIH): 122 mg/dL — ABNORMAL HIGH (ref 0–99)
Triglycerides: 172 mg/dL — ABNORMAL HIGH (ref 0–149)
VLDL Cholesterol Cal: 30 mg/dL (ref 5–40)

## 2022-04-27 LAB — CBC WITH DIFFERENTIAL/PLATELET
Basophils Absolute: 0.1 10*3/uL (ref 0.0–0.2)
Basos: 1 %
EOS (ABSOLUTE): 0.2 10*3/uL (ref 0.0–0.4)
Eos: 3 %
Hematocrit: 42.3 % (ref 37.5–51.0)
Hemoglobin: 14.3 g/dL (ref 13.0–17.7)
Immature Grans (Abs): 0 10*3/uL (ref 0.0–0.1)
Immature Granulocytes: 0 %
Lymphocytes Absolute: 2.4 10*3/uL (ref 0.7–3.1)
Lymphs: 43 %
MCH: 31.1 pg (ref 26.6–33.0)
MCHC: 33.8 g/dL (ref 31.5–35.7)
MCV: 92 fL (ref 79–97)
Monocytes Absolute: 0.4 10*3/uL (ref 0.1–0.9)
Monocytes: 7 %
Neutrophils Absolute: 2.6 10*3/uL (ref 1.4–7.0)
Neutrophils: 46 %
Platelets: 143 10*3/uL — ABNORMAL LOW (ref 150–450)
RBC: 4.6 x10E6/uL (ref 4.14–5.80)
RDW: 14 % (ref 11.6–15.4)
WBC: 5.6 10*3/uL (ref 3.4–10.8)

## 2022-04-27 LAB — HEMOGLOBIN A1C
Est. average glucose Bld gHb Est-mCnc: 120 mg/dL
Hgb A1c MFr Bld: 5.8 % — ABNORMAL HIGH (ref 4.8–5.6)

## 2022-04-27 LAB — TSH: TSH: 2.7 u[IU]/mL (ref 0.450–4.500)

## 2022-05-12 ENCOUNTER — Encounter: Payer: Self-pay | Admitting: Cardiovascular Disease

## 2022-05-12 ENCOUNTER — Ambulatory Visit: Payer: Commercial Managed Care - HMO | Admitting: Cardiovascular Disease

## 2022-05-12 VITALS — BP 130/72 | HR 76 | Ht 67.0 in | Wt 200.8 lb

## 2022-05-12 DIAGNOSIS — R072 Precordial pain: Secondary | ICD-10-CM | POA: Diagnosis not present

## 2022-05-12 DIAGNOSIS — Z8249 Family history of ischemic heart disease and other diseases of the circulatory system: Secondary | ICD-10-CM | POA: Diagnosis not present

## 2022-05-12 DIAGNOSIS — E782 Mixed hyperlipidemia: Secondary | ICD-10-CM

## 2022-05-12 DIAGNOSIS — R0602 Shortness of breath: Secondary | ICD-10-CM

## 2022-05-12 MED ORDER — IBUPROFEN 800 MG PO TABS: 800.0000 mg | ORAL_TABLET | Freq: Three times a day (TID) | ORAL | 0 refills | Status: DC

## 2022-05-12 MED ORDER — METOPROLOL TARTRATE 100 MG PO TABS: ORAL_TABLET | ORAL | 0 refills | Status: DC

## 2022-05-12 NOTE — Patient Instructions (Signed)
Medication Instructions:  Take Metoprolol 100 mg two hours before CT when scheduled.   Take Ibuprofen 800 mg three times daily for 7 days. (Drink plenty of water)   *If you need a refill on your cardiac medications before your next appointment, please call your pharmacy*   Testing/Procedures: Your physician has requested that you have cardiac CT. Cardiac computed tomography (CT) is a painless test that uses an x-ray machine to take clear, detailed pictures of your heart. For further information please visit [email or web address]. Please follow instruction sheet as given.  Echocardiogram - Your physician has requested that you have an echocardiogram. Echocardiography is a painless test that uses sound waves to create images of your heart. It provides your doctor with information about the size and shape of your heart and how well your heart's chambers and valves are working. This procedure takes approximately one hour. There are no restrictions for this procedure.     Follow-Up: At [facility], you and your health needs are our priority.  As part of our continuing mission to provide you with exceptional heart care, we have created designated Provider Care Teams.  These Care Teams include your primary Cardiologist (physician) and Advanced Practice Providers (APPs -  Physician Assistants and Nurse Practitioners) who all work together to provide you with the care you need, when you need it.  We recommend signing up for the patient portal called "MyChart".  Sign up information is provided on this After Visit Summary.  MyChart is used to connect with patients for Virtual Visits (Telemedicine).  Patients are able to view lab/test results, encounter notes, upcoming appointments, etc.  Non-urgent messages can be sent to your provider as well.   To learn more about what you can do with MyChart, go to [email or web address].    Your next appointment:   As needed  The format for your next appointment:    In Person  Provider:   Eleonore Chiquito, MD   Other Instructions   Your cardiac CT will be scheduled at one of the below locations:   [facility] [address] [place], [place] [address] [phone]   If scheduled at [facility], please arrive at the [facility] and Children's Entrance (Entrance C2) of [facility] 30 minutes prior to test start time. You can use the FREE valet parking offered at entrance C (encouraged to control the heart rate for the test)  Proceed to the [facility] Radiology Department (first floor) to check-in and test prep.  All radiology patients and guests should use entrance C2 at [facility], accessed from [facility], even though the hospital's physical address listed is [address]     Please follow these instructions carefully (unless otherwise directed):  Hold all erectile dysfunction medications at least 3 days (72 hrs) prior to test.  On the Night Before the Test: Be sure to Drink plenty of water. Do not consume any caffeinated/decaffeinated beverages or chocolate 12 hours prior to your test. Do not take any antihistamines 12 hours prior to your test.  On the Day of the Test: Drink plenty of water until 1 hour prior to the test. Do not eat any food 4 hours prior to the test. You may take your regular medications prior to the test.  Take metoprolol (Lopressor) two hours prior to test. HOLD Furosemide/Hydrochlorothiazide morning of the test.  After the Test: Drink plenty of water. After receiving IV contrast, you may experience a mild flushed feeling. This  is normal. On occasion, you may experience a mild rash up to 24 hours after the test. This is not dangerous. If this occurs, you can take Benadryl 25 mg and increase your fluid intake. If you experience trouble breathing, this can be serious. If it is severe call 911 IMMEDIATELY. If it is mild, please call our office. If you take  any of these medications: Glipizide/Metformin, Avandament, Glucavance, please do not take 48 hours after completing test unless otherwise instructed.  We will call to schedule your test 2-4 weeks out understanding that some insurance companies will need an authorization prior to the service being performed.   For non-scheduling related questions, please contact the cardiac imaging nurse navigator should you have any questions/concerns: Marchia Bond, Cardiac Imaging Nurse Navigator Gordy Clement, Cardiac Imaging Nurse Navigator [facility] Direct Office Dial: [phone]   For scheduling needs, including cancellations and rescheduling, please call [place], [phone].
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
## 2022-05-17 NOTE — Addendum Note (Signed)
Addended by: Hinton Dyer on: 05/17/2022 10:09 AM   Modules accepted: Orders

## 2022-05-23 ENCOUNTER — Ambulatory Visit (INDEPENDENT_AMBULATORY_CARE_PROVIDER_SITE_OTHER): Payer: Commercial Managed Care - HMO

## 2022-05-23 DIAGNOSIS — I517 Cardiomegaly: Secondary | ICD-10-CM | POA: Diagnosis not present

## 2022-05-23 DIAGNOSIS — I351 Nonrheumatic aortic (valve) insufficiency: Secondary | ICD-10-CM

## 2022-05-23 DIAGNOSIS — R072 Precordial pain: Secondary | ICD-10-CM

## 2022-05-23 DIAGNOSIS — R0602 Shortness of breath: Secondary | ICD-10-CM

## 2022-05-23 LAB — ECHOCARDIOGRAM COMPLETE
AR max vel: 1.86 cm2
AV Area VTI: 1.94 cm2
AV Area mean vel: 1.73 cm2
AV Mean grad: 3 mmHg
AV Peak grad: 6.3 mmHg
Ao pk vel: 1.25 m/s
Area-P 1/2: 4.31 cm2
P 1/2 time: 546 msec
S' Lateral: 3.15 cm

## 2022-06-01 ENCOUNTER — Encounter (HOSPITAL_COMMUNITY): Payer: Self-pay

## 2022-06-02 ENCOUNTER — Telehealth (HOSPITAL_COMMUNITY): Payer: Self-pay | Admitting: Emergency Medicine

## 2022-06-02 NOTE — Telephone Encounter (Signed)
Reaching out to patient to offer assistance regarding upcoming cardiac imaging study; pt verbalizes understanding of appt date/time, parking situation and where to check in, pre-test NPO status and medications ordered, and verified current allergies; name and call back number provided for further questions should they arise Marchia Bond RN Navigator Cardiac Imaging [facility] [phone] office [phone] cell  Previous surg to L arm, prefers R arm for IV '100mg'$  metoprolol tart Arrival 1130 w/c entrance Aware of nitro Speaks english

## 2022-06-05 ENCOUNTER — Ambulatory Visit (HOSPITAL_COMMUNITY)
Admission: RE | Admit: 2022-06-05 | Discharge: 2022-06-05 | Disposition: A | Discharge status: Home / Self Care | Payer: Commercial Managed Care - HMO | Source: Ambulatory Visit | Attending: Cardiovascular Disease | Admitting: Cardiovascular Disease

## 2022-06-05 DIAGNOSIS — R072 Precordial pain: Secondary | ICD-10-CM | POA: Diagnosis present

## 2022-06-05 MED ORDER — NITROGLYCERIN 0.4 MG SL SUBL
SUBLINGUAL_TABLET | SUBLINGUAL | Status: AC
  Filled 2022-06-05: qty 2

## 2022-06-05 MED ORDER — NITROGLYCERIN 0.4 MG SL SUBL
0.8000 mg | SUBLINGUAL_TABLET | Freq: Once | SUBLINGUAL | Status: AC
  Administered 2022-06-05: 0.8 mg via SUBLINGUAL

## 2022-06-05 MED ORDER — IOHEXOL 350 MG/ML SOLN
100.0000 mL | Freq: Once | INTRAVENOUS | Status: AC | PRN
  Administered 2022-06-05: 100 mL via INTRAVENOUS

## 2022-06-12 ENCOUNTER — Ambulatory Visit (HOSPITAL_BASED_OUTPATIENT_CLINIC_OR_DEPARTMENT_OTHER): Payer: BLUE CROSS/BLUE SHIELD

## 2022-06-12 ENCOUNTER — Ambulatory Visit (INDEPENDENT_AMBULATORY_CARE_PROVIDER_SITE_OTHER): Payer: Commercial Managed Care - HMO | Admitting: Neurology

## 2022-06-12 ENCOUNTER — Encounter: Payer: Self-pay | Admitting: Neurology

## 2022-06-12 VITALS — BP 144/92 | HR 88 | Ht 67.0 in | Wt 203.4 lb

## 2022-06-12 DIAGNOSIS — R0683 Snoring: Secondary | ICD-10-CM

## 2022-06-12 DIAGNOSIS — R0681 Apnea, not elsewhere classified: Secondary | ICD-10-CM

## 2022-06-12 DIAGNOSIS — G4730 Sleep apnea, unspecified: Secondary | ICD-10-CM

## 2022-06-12 DIAGNOSIS — G4700 Insomnia, unspecified: Secondary | ICD-10-CM

## 2022-06-12 DIAGNOSIS — E669 Obesity, unspecified: Secondary | ICD-10-CM

## 2022-06-12 DIAGNOSIS — G4719 Other hypersomnia: Secondary | ICD-10-CM

## 2022-06-12 DIAGNOSIS — E66811 Obesity, class 1: Secondary | ICD-10-CM

## 2022-06-12 DIAGNOSIS — R058 Other specified cough: Secondary | ICD-10-CM

## 2022-06-12 DIAGNOSIS — G478 Other sleep disorders: Secondary | ICD-10-CM | POA: Diagnosis not present

## 2022-06-12 NOTE — Patient Instructions (Signed)
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
## 2022-06-13 ENCOUNTER — Ambulatory Visit (HOSPITAL_BASED_OUTPATIENT_CLINIC_OR_DEPARTMENT_OTHER): Payer: Commercial Managed Care - HMO

## 2022-06-13 DIAGNOSIS — Z0000 Encounter for general adult medical examination without abnormal findings: Secondary | ICD-10-CM

## 2022-06-14 LAB — CBC WITH DIFF/PLATELET
Basophils Absolute: 0 10*3/uL (ref 0.0–0.2)
Basos: 0 %
EOS (ABSOLUTE): 0.3 10*3/uL (ref 0.0–0.4)
Eos: 4 %
Hematocrit: 42.7 % (ref 37.5–51.0)
Hemoglobin: 14.1 g/dL (ref 13.0–17.7)
Immature Grans (Abs): 0 10*3/uL (ref 0.0–0.1)
Immature Granulocytes: 0 %
Lymphocytes Absolute: 2.3 10*3/uL (ref 0.7–3.1)
Lymphs: 34 %
MCH: 31.2 pg (ref 26.6–33.0)
MCHC: 33 g/dL (ref 31.5–35.7)
MCV: 95 fL (ref 79–97)
Monocytes Absolute: 0.6 10*3/uL (ref 0.1–0.9)
Monocytes: 9 %
Neutrophils Absolute: 3.6 10*3/uL (ref 1.4–7.0)
Neutrophils: 53 %
Platelets: 137 10*3/uL — ABNORMAL LOW (ref 150–450)
RBC: 4.52 x10E6/uL (ref 4.14–5.80)
RDW: 13.7 % (ref 11.6–15.4)
WBC: 6.8 10*3/uL (ref 3.4–10.8)

## 2022-06-14 LAB — COMPREHENSIVE METABOLIC PANEL
ALT: 18 IU/L (ref 0–44)
AST: 16 IU/L (ref 0–40)
Albumin/Globulin Ratio: 1.8 (ref 1.2–2.2)
Albumin: 4.2 g/dL (ref 3.8–4.9)
Alkaline Phosphatase: 72 IU/L (ref 44–121)
BUN/Creatinine Ratio: 12 (ref 9–20)
BUN: 12 mg/dL (ref 6–24)
Bilirubin Total: <0.2 mg/dL (ref 0.0–1.2)
CO2: 20 mmol/L (ref 20–29)
Calcium: 8.9 mg/dL (ref 8.7–10.2)
Chloride: 103 mmol/L (ref 96–106)
Creatinine, Ser: 1.01 mg/dL (ref 0.76–1.27)
Globulin, Total: 2.3 g/dL (ref 1.5–4.5)
Glucose: 96 mg/dL (ref 70–99)
Potassium: 4 mmol/L (ref 3.5–5.2)
Sodium: 139 mmol/L (ref 134–144)
Total Protein: 6.5 g/dL (ref 6.0–8.5)
eGFR: 86 mL/min/{1.73_m2} (ref >59)

## 2022-06-14 LAB — LIPID PANEL
Chol/HDL Ratio: 4 ratio (ref 0.0–5.0)
Cholesterol, Total: 202 mg/dL — ABNORMAL HIGH (ref 100–199)
HDL: 50 mg/dL (ref >39)
LDL Chol Calc (NIH): 112 mg/dL — ABNORMAL HIGH (ref 0–99)
Triglycerides: 232 mg/dL — ABNORMAL HIGH (ref 0–149)
VLDL Cholesterol Cal: 40 mg/dL (ref 5–40)

## 2022-06-14 LAB — THYROID PANEL WITH TSH
Free Thyroxine Index: 1.7 (ref 1.2–4.9)
T3 Uptake Ratio: 25 % (ref 24–39)
T4, Total: 6.6 ug/dL (ref 4.5–12.0)
TSH: 2.55 u[IU]/mL (ref 0.450–4.500)

## 2022-06-14 LAB — HEMOGLOBIN A1C
Est. average glucose Bld gHb Est-mCnc: 126 mg/dL
Hgb A1c MFr Bld: 6 % — ABNORMAL HIGH (ref 4.8–5.6)

## 2022-06-21 ENCOUNTER — Ambulatory Visit (INDEPENDENT_AMBULATORY_CARE_PROVIDER_SITE_OTHER): Payer: Commercial Managed Care - HMO | Admitting: Family Medicine

## 2022-06-21 ENCOUNTER — Encounter (HOSPITAL_BASED_OUTPATIENT_CLINIC_OR_DEPARTMENT_OTHER): Payer: Self-pay | Admitting: Family Medicine

## 2022-06-21 VITALS — BP 132/85 | HR 72 | Ht 67.0 in | Wt 201.3 lb

## 2022-06-21 DIAGNOSIS — Z23 Encounter for immunization: Secondary | ICD-10-CM

## 2022-06-21 DIAGNOSIS — E782 Mixed hyperlipidemia: Secondary | ICD-10-CM

## 2022-06-21 DIAGNOSIS — Z0000 Encounter for general adult medical examination without abnormal findings: Secondary | ICD-10-CM | POA: Diagnosis not present

## 2022-06-21 DIAGNOSIS — Z125 Encounter for screening for malignant neoplasm of prostate: Secondary | ICD-10-CM | POA: Diagnosis not present

## 2022-06-21 DIAGNOSIS — K7689 Other specified diseases of liver: Secondary | ICD-10-CM

## 2022-06-21 MED ORDER — SIMVASTATIN 20 MG PO TABS: 20.0000 mg | ORAL_TABLET | Freq: Every day | ORAL | 1 refills | Status: DC

## 2022-06-21 MED ORDER — SHINGRIX 50 MCG/0.5ML IM SUSR: 0.5000 mL | Freq: Once | INTRAMUSCULAR | 0 refills | Status: AC
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
## 2022-06-21 NOTE — Assessment & Plan Note (Signed)
Routine HCM labs reviewed. HCM reviewed/discussed. Anticipatory guidance regarding healthy weight, lifestyle and choices given. Recommend healthy diet.  Recommend approximately 150 minutes/week of moderate intensity exercise Recommend regular dental and vision exams Always use seatbelt/lap and shoulder restraints Recommend using smoke alarms and checking batteries at least twice a year Recommend using sunscreen when outside Discussed colon cancer screening recommendations, options.  Patient is UTD Discussed recommendations for shingles vaccine.  Patient needs second dose, Rx sent to pharmacy Discussed tetanus immunization recommendations, patient is UTD Due for seasonal influenza, administered today

## 2022-06-21 NOTE — Patient Instructions (Signed)
  Medication Instructions:  Your physician recommends that you continue on your current medications as directed. Please refer to the Current Medication list given to you today. --If you need a refill on any your medications before your next appointment, please call your pharmacy first. If no refills are authorized on file call the office.-- Lab Work: Your physician has recommended that you have lab work today: No If you have labs (blood work) drawn today and your tests are completely normal, you will receive your results via [facility] a phone call from our staff.  Please ensure you check your voicemail in the event that you authorized detailed messages to be left on a delegated number. If you have any lab test that is abnormal or we need to change your treatment, we will call you to review the results.  Referrals/Procedures/Imaging: No  Follow-Up: Your next appointment:   Your physician recommends that you schedule a follow-up appointment in: 4-6 months follow-up with Dr. de [place].  You will receive a text message or e-mail with a link to a survey about your care and experience with [place] today! We would greatly appreciate your feedback!   Thanks for letting us be apart of your health journey!!  Primary Care and Sports Medicine   Dr. Arlina Robes [place]   We encourage you to activate your patient portal called "MyChart".  Sign up information is provided on this After Visit Summary.  MyChart is used to connect with patients for Virtual Visits (Telemedicine).  Patients are able to view lab/test results, encounter notes, upcoming appointments, etc.  Non-urgent messages can be sent to your provider as well. To learn more about what you can do with MyChart, please visit --  [email or web address].

## 2022-06-23 LAB — PSA TOTAL (REFLEX TO FREE): Prostate Specific Ag, Serum: 1.3 ng/mL (ref 0.0–4.0)

## 2022-06-27 DIAGNOSIS — K7689 Other specified diseases of liver: Secondary | ICD-10-CM | POA: Insufficient documentation

## 2022-06-27 NOTE — Assessment & Plan Note (Signed)
Observed incidentally on CT scan being completed for coronary artery calcium scoring.  Reviewed that based on radiology review, it appears that the cyst is very small and does not appear to have any specific concerning findings.  Do not feel that further evaluation is needed at this time.

## 2022-07-03 ENCOUNTER — Other Ambulatory Visit: Payer: Self-pay

## 2022-07-03 MED ORDER — ROSUVASTATIN CALCIUM 40 MG PO TABS: 40.0000 mg | ORAL_TABLET | Freq: Every day | ORAL | 3 refills | Status: DC

## 2022-07-06 ENCOUNTER — Telehealth: Payer: Self-pay | Admitting: Neurology

## 2022-07-06 NOTE — Telephone Encounter (Signed)
[employer] pending faxed notes  

## 2022-07-12 NOTE — Telephone Encounter (Signed)
[employer] denied the NPSG.  [employer] no auth req for HST ref # [id number].  left VM 07/11/22 [place]  LVM for pt to call back

## 2022-07-20 NOTE — Telephone Encounter (Signed)
HST- mailout device No auth required Ref# [id number]    Mailed out scheduled for 07/26/22.

## 2022-07-26 ENCOUNTER — Ambulatory Visit: Payer: Commercial Managed Care - HMO | Admitting: Neurology

## 2022-07-26 DIAGNOSIS — G4719 Other hypersomnia: Secondary | ICD-10-CM

## 2022-07-26 DIAGNOSIS — G478 Other sleep disorders: Secondary | ICD-10-CM

## 2022-07-26 DIAGNOSIS — R0683 Snoring: Secondary | ICD-10-CM

## 2022-07-26 DIAGNOSIS — G4700 Insomnia, unspecified: Secondary | ICD-10-CM

## 2022-07-26 DIAGNOSIS — E669 Obesity, unspecified: Secondary | ICD-10-CM

## 2022-07-26 DIAGNOSIS — G4733 Obstructive sleep apnea (adult) (pediatric): Secondary | ICD-10-CM

## 2022-07-26 DIAGNOSIS — G4730 Sleep apnea, unspecified: Secondary | ICD-10-CM

## 2022-07-26 DIAGNOSIS — R058 Other specified cough: Secondary | ICD-10-CM

## 2022-07-26 DIAGNOSIS — R0681 Apnea, not elsewhere classified: Secondary | ICD-10-CM

## 2022-08-09 NOTE — Procedures (Signed)
[facility]  HOME SLEEP TEST (Watch PAT) REPORT (mail out device)  STUDY DATE: 08/08/2022  DOB: Mar 20, 1961  MRN: [id number]  ORDERING CLINICIAN: Star Age, MD, PhD   REFERRING CLINICIAN: de [place], Blondell Reveal, MD   CLINICAL INFORMATION/HISTORY: 61 year old right-handed gentleman with an underlying medical history of allergies, diverticulosis, reflux disease, prediabetes, dysphagia, hyperlipidemia, seizures in childhood, status post numerous surgeries, and borderline obesity, who reports snoring and excessive daytime somnolence as well as witnessed apneas.  Epworth sleepiness score: 4/24.  BMI: 31.8 kg/m  FINDINGS:   Sleep Summary:   Total Recording Time (hours, min): 8 hours, 48 min  Total Sleep Time (hours, min):  6 hours, 59 min  Percent REM (%):    14.3%   Respiratory Indices:   Calculated pAHI (per hour):  39.3/hour         REM pAHI:    55.4/hour       NREM pAHI: 36.6/hour  Central pAHI: 4.2/hour  Oxygen Saturation Statistics:    Oxygen Saturation (%) Mean: 94%   Minimum oxygen saturation (%):                 86%   O2 Saturation Range (%): 86- 99%    O2 Saturation (minutes) <=88%: 0.7 min  Pulse Rate Statistics:   Pulse Mean (bpm):    70/min    Pulse Range (51 - 112/min)   IMPRESSION: OSA (obstructive sleep apnea)   RECOMMENDATION:  This home sleep test demonstrates severe obstructive sleep apnea with a total AHI of 39.3/hour and O2 nadir of 86%.  Snoring was detected, in the mild to moderate range, at times louder.  Treatment with positive airway pressure is highly recommended. This will require - ideally - a full night CPAP titration study for proper treatment settings, O2 monitoring and mask fitting. For now, the patient will be advised to proceed with an autoPAP titration/trial at home.  A laboratory attended titration study can be considered in the future for optimization of his treatment and better tolerance of therapy.   Alternative treatment options are limited secondary to the severity of the patient's sleep disordered breathing, but may include surgical treatment with Inspire, hypoglossal nerve stimulator, in carefully selected candidates meeting criteria.  Concomitant weight loss is recommended, where clinically appropriate. Please note, that untreated obstructive sleep apnea may carry additional perioperative morbidity. Patients with significant obstructive sleep apnea should receive perioperative PAP therapy and the surgeons and particularly the anesthesiologist should be informed of the diagnosis and the severity of the sleep disordered breathing. The patient should be cautioned not to drive, work at heights, or operate dangerous or heavy equipment when tired or sleepy. Review and reiteration of good sleep hygiene measures should be pursued with any patient. Other causes of the patient's symptoms, including circadian rhythm disturbances, an underlying mood disorder, medication effect and/or an underlying medical problem cannot be ruled out based on this test. Clinical correlation is recommended.  The patient and his referring provider will be notified of the test results. The patient will be seen in follow up in sleep clinic at [facility].  I certify that I have reviewed the raw data recording prior to the issuance of this report in accordance with the standards of the American Academy of Sleep Medicine (AASM).  INTERPRETING PHYSICIAN:   Star Age, MD, PhD  Board Certified in Neurology and Sleep Medicine  [facility] [address], [place], [place] [address] [phone]

## 2022-08-09 NOTE — Addendum Note (Signed)
Addended by: Star Age on: 08/09/2022 06:41 PM   Modules accepted: Orders

## 2022-08-09 NOTE — Progress Notes (Signed)
See procedure note.

## 2022-08-14 ENCOUNTER — Other Ambulatory Visit: Payer: Self-pay | Admitting: Family Medicine

## 2022-08-14 ENCOUNTER — Other Ambulatory Visit (HOSPITAL_BASED_OUTPATIENT_CLINIC_OR_DEPARTMENT_OTHER): Payer: Self-pay | Admitting: Family Medicine

## 2022-08-14 ENCOUNTER — Telehealth: Payer: Self-pay

## 2022-08-14 NOTE — Progress Notes (Signed)
CPAP Orders have been faxed to DME on file [facility].08/14/22

## 2022-08-14 NOTE — Telephone Encounter (Signed)
-----   Message from Star Age, MD sent at 08/09/2022  6:38 PM EDT ----- Patient referred by PCP, seen by me on 06/12/2022, patient had a HST on 08/08/2022.    Please call and notify the patient that the recent home sleep test showed obstructive sleep apnea in the severe range. I recommend treatment for this in the form of autoPAP, which means, that we don't have to bring him in for a sleep study with CPAP, but will let him start using a so called autoPAP machine at home, through a DME company (of his choice, or as per insurance requirement). The DME representative will fit the patient with a mask of choice, educate him on how to use the machine, how to put the mask on, etc. I have placed an order in the chart. Please send the order to a local DME, talk to patient, send report to referring MD. Please also reinforce the need for compliance with treatment. We will need a FU in sleep clinic for 10 weeks post-PAP set up, please arrange that with me or one of our NPs. Thanks,   Star Age, MD, PhD [facility])

## 2022-08-14 NOTE — Telephone Encounter (Signed)
I called pt. I advised pt that Dr. Rexene Alberts reviewed their sleep study results and found that pt has severe osa. Dr. Rexene Alberts recommends that pt start an auto pap at home. I reviewed PAP compliance expectations with the pt. Pt is agreeable to starting an auto-PAP. I advised pt that an order will be sent to a DME, [facility], and [facility] will call the pt within about one week after they file with the pt's insurance. [facility] will show the pt how to use the machine, fit for masks, and troubleshoot the auto-PAP if needed. A follow up appt was made for insurance purposes with Dr. Rexene Alberts on 11/13/22 at 1:15pm. Pt verbalized understanding to arrive 15 minutes early and bring their auto-PAP. Pt verbalized understanding of results. Pt had no questions at this time but was encouraged to call back if questions arise. I have sent the order to [facility] and have received confirmation that they have received the order.

## 2022-08-15 ENCOUNTER — Other Ambulatory Visit (HOSPITAL_BASED_OUTPATIENT_CLINIC_OR_DEPARTMENT_OTHER): Payer: Self-pay

## 2022-08-15 ENCOUNTER — Other Ambulatory Visit (HOSPITAL_COMMUNITY): Payer: Self-pay

## 2022-08-15 MED ORDER — LEVOTHYROXINE SODIUM 50 MCG PO TABS
50.0000 ug | ORAL_TABLET | Freq: Every day | ORAL | 0 refills | Status: DC
  Filled 2022-08-15 – 2022-08-27 (×3): qty 90, 90d supply, fill #0
  Filled 2022-10-20: qty 30, 30d supply, fill #0

## 2022-08-15 MED ORDER — PAROXETINE HCL 20 MG PO TABS: 20.0000 mg | ORAL_TABLET | Freq: Every day | ORAL | 1 refills | Status: DC

## 2022-08-16 ENCOUNTER — Encounter (HOSPITAL_BASED_OUTPATIENT_CLINIC_OR_DEPARTMENT_OTHER): Payer: Self-pay | Admitting: Family Medicine

## 2022-08-16 ENCOUNTER — Ambulatory Visit (HOSPITAL_BASED_OUTPATIENT_CLINIC_OR_DEPARTMENT_OTHER): Payer: Commercial Managed Care - HMO | Admitting: Family Medicine

## 2022-08-16 VITALS — BP 128/89 | HR 71 | Temp 97.6°F | Ht 67.0 in | Wt 202.6 lb

## 2022-08-16 DIAGNOSIS — E039 Hypothyroidism, unspecified: Secondary | ICD-10-CM | POA: Diagnosis not present

## 2022-08-16 DIAGNOSIS — E782 Mixed hyperlipidemia: Secondary | ICD-10-CM

## 2022-08-16 MED ORDER — FLUTICASONE PROPIONATE 50 MCG/ACT NA SUSP: 1.0000 | Freq: Every day | NASAL | 1 refills | Status: DC

## 2022-08-16 NOTE — Assessment & Plan Note (Signed)
Patient continues with levothyroxine, most recent TSH was normal.  Denies any current symptoms related to temperature intolerance, notable weight changes, changes in bowel movements. For now, we will continue with same dose of levothyroxine and plan to recheck TSH around time of next office visit in about 3 to 4 months

## 2022-08-16 NOTE — Progress Notes (Signed)
    Procedures performed today:    None.  Independent interpretation of notes and tests performed by another provider:   None.  Brief History, Exam, Impression, and Recommendations:    BP 128/89   Pulse 71   Temp 97.6 F (36.4 C) (Oral)   Ht '5\' 7"'$  (1.702 m)   Wt 202 lb 9.6 oz (91.9 kg)   SpO2 98%   BMI 31.73 kg/m   Mixed hyperlipidemia Patient was started on rosuvastatin by cardiology.  He has been tolerating the medication well.  He does have follow-up visit scheduled in about 3 months with cardiology.  Denies any current issues related to medication including muscle aches. Recommend continue with current dose of medication and following up as scheduled with cardiology.  Expect that cardiology will recheck lipid panel around time of next office visit to assess progress with rosuvastatin  Hypothyroidism Patient continues with levothyroxine, most recent TSH was normal.  Denies any current symptoms related to temperature intolerance, notable weight changes, changes in bowel movements. For now, we will continue with same dose of levothyroxine and plan to recheck TSH around time of next office visit in about 3 to 4 months  Patient did bring in paperwork today related to filing for Social Security disability.  He reports being told by his Social Security disability administrator/representative to have paperwork completed by his PCP.  In discussing with patient, he primarily indicates MSK issues as underlying reason for pursuing disability.  He does follow with [facility] providers related to various issues including Dupuytren's contracture, carpal tunnel syndrome, left shoulder pain related to impingement, rotator cuff abnormality, arthritis, low back pain.  Discussed with patient that given prior surgical interventions and planned procedural interventions including injections as well as upcoming surgery on the left shoulder, his orthopedic surgeon is going to be better suited to provide  accurate determination of expected restrictions and corresponding disability due to their expertise in this matter.  As such, would recommend discussing documentation further with his orthopedic providers have requested paperwork completed.  Patient voiced understanding and agreement with plan.  He will further discuss with his providers at [facility]  Return in about 3 months (around 11/16/2022) for Hypothyroidism, HLD.   ___________________________________________ Brenon Antosh de [place], MD, ABFM, [facility] and [place]

## 2022-08-16 NOTE — Assessment & Plan Note (Signed)
Patient was started on rosuvastatin by cardiology.  He has been tolerating the medication well.  He does have follow-up visit scheduled in about 3 months with cardiology.  Denies any current issues related to medication including muscle aches. Recommend continue with current dose of medication and following up as scheduled with cardiology.  Expect that cardiology will recheck lipid panel around time of next office visit to assess progress with rosuvastatin

## 2022-08-16 NOTE — Patient Instructions (Signed)

## 2022-08-18 ENCOUNTER — Ambulatory Visit (INDEPENDENT_AMBULATORY_CARE_PROVIDER_SITE_OTHER): Payer: Commercial Managed Care - HMO | Admitting: Orthopaedic Surgery

## 2022-08-18 DIAGNOSIS — S46002A Unspecified injury of muscle(s) and tendon(s) of the rotator cuff of left shoulder, initial encounter: Secondary | ICD-10-CM | POA: Diagnosis not present
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
## 2022-08-23 ENCOUNTER — Ambulatory Visit: Payer: Commercial Managed Care - HMO | Admitting: Rehabilitative and Restorative Service Providers"
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
## 2022-08-28 ENCOUNTER — Ambulatory Visit (HOSPITAL_BASED_OUTPATIENT_CLINIC_OR_DEPARTMENT_OTHER): Payer: Commercial Managed Care - HMO | Admitting: Physical Therapy

## 2022-08-28 ENCOUNTER — Encounter (HOSPITAL_BASED_OUTPATIENT_CLINIC_OR_DEPARTMENT_OTHER): Payer: Self-pay

## 2022-08-28 ENCOUNTER — Other Ambulatory Visit (HOSPITAL_COMMUNITY): Payer: Self-pay
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
## 2022-09-07 ENCOUNTER — Telehealth: Payer: Self-pay | Admitting: Neurology

## 2022-09-07 NOTE — Telephone Encounter (Signed)
LVM and sent mychart msg informing pt of appointment change from 11/13/22 to 11/08/22 due to MD being out

## 2022-09-22 ENCOUNTER — Ambulatory Visit (HOSPITAL_BASED_OUTPATIENT_CLINIC_OR_DEPARTMENT_OTHER): Payer: Commercial Managed Care - HMO | Admitting: Orthopaedic Surgery

## 2022-09-26 ENCOUNTER — Other Ambulatory Visit: Payer: Self-pay

## 2022-10-10 ENCOUNTER — Other Ambulatory Visit (HOSPITAL_BASED_OUTPATIENT_CLINIC_OR_DEPARTMENT_OTHER): Payer: Self-pay | Admitting: Family Medicine

## 2022-10-20 ENCOUNTER — Other Ambulatory Visit: Payer: Self-pay

## 2022-10-30 DIAGNOSIS — M5416 Radiculopathy, lumbar region: Secondary | ICD-10-CM | POA: Diagnosis not present

## 2022-10-30 DIAGNOSIS — M5412 Radiculopathy, cervical region: Secondary | ICD-10-CM | POA: Diagnosis not present

## 2022-10-31 ENCOUNTER — Other Ambulatory Visit: Payer: Self-pay
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
## 2022-11-08 ENCOUNTER — Ambulatory Visit: Payer: 59 | Admitting: Adult Health

## 2022-11-13 ENCOUNTER — Ambulatory Visit: Payer: Commercial Managed Care - HMO | Admitting: Neurology

## 2022-11-13 DIAGNOSIS — M720 Palmar fascial fibromatosis [Dupuytren]: Secondary | ICD-10-CM | POA: Diagnosis not present
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
## 2022-11-16 ENCOUNTER — Ambulatory Visit: Payer: 59 | Attending: Cardiovascular Disease | Admitting: Cardiovascular Disease

## 2022-11-16 ENCOUNTER — Encounter: Payer: Self-pay | Admitting: Cardiovascular Disease

## 2022-11-16 ENCOUNTER — Encounter (HOSPITAL_BASED_OUTPATIENT_CLINIC_OR_DEPARTMENT_OTHER): Payer: Self-pay | Admitting: Family Medicine

## 2022-11-16 ENCOUNTER — Ambulatory Visit (INDEPENDENT_AMBULATORY_CARE_PROVIDER_SITE_OTHER): Payer: 59 | Admitting: Family Medicine

## 2022-11-16 VITALS — BP 126/80 | HR 76 | Ht 67.0 in | Wt 207.0 lb

## 2022-11-16 VITALS — BP 139/85 | HR 66 | Resp 18 | Ht 67.0 in | Wt 204.0 lb

## 2022-11-16 DIAGNOSIS — F411 Generalized anxiety disorder: Secondary | ICD-10-CM

## 2022-11-16 DIAGNOSIS — R7303 Prediabetes: Secondary | ICD-10-CM | POA: Diagnosis not present

## 2022-11-16 DIAGNOSIS — E039 Hypothyroidism, unspecified: Secondary | ICD-10-CM

## 2022-11-16 DIAGNOSIS — E782 Mixed hyperlipidemia: Secondary | ICD-10-CM

## 2022-11-16 DIAGNOSIS — I2510 Atherosclerotic heart disease of native coronary artery without angina pectoris: Secondary | ICD-10-CM | POA: Diagnosis not present

## 2022-11-16 DIAGNOSIS — R69 Illness, unspecified: Secondary | ICD-10-CM | POA: Diagnosis not present

## 2022-11-16 LAB — LIPID PANEL
Chol/HDL Ratio: 3.1 ratio (ref 0.0–5.0)
Cholesterol, Total: 147 mg/dL (ref 100–199)
HDL: 48 mg/dL (ref >39)
LDL Chol Calc (NIH): 72 mg/dL (ref 0–99)
Triglycerides: 160 mg/dL — ABNORMAL HIGH (ref 0–149)
VLDL Cholesterol Cal: 27 mg/dL (ref 5–40)

## 2022-11-16 LAB — CBC WITH DIFFERENTIAL/PLATELET
Basophils Absolute: 0 10*3/uL (ref 0.0–0.2)
Basos: 1 %
EOS (ABSOLUTE): 0.2 10*3/uL (ref 0.0–0.4)
Eos: 3 %
Hematocrit: 43.5 % (ref 37.5–51.0)
Hemoglobin: 14.3 g/dL (ref 13.0–17.7)
Immature Grans (Abs): 0 10*3/uL (ref 0.0–0.1)
Immature Granulocytes: 0 %
Lymphocytes Absolute: 2.6 10*3/uL (ref 0.7–3.1)
Lymphs: 45 %
MCH: 30.5 pg (ref 26.6–33.0)
MCHC: 32.9 g/dL (ref 31.5–35.7)
MCV: 93 fL (ref 79–97)
Monocytes Absolute: 0.4 10*3/uL (ref 0.1–0.9)
Monocytes: 7 %
Neutrophils Absolute: 2.5 10*3/uL (ref 1.4–7.0)
Neutrophils: 44 %
Platelets: 128 10*3/uL — ABNORMAL LOW (ref 150–450)
RBC: 4.69 x10E6/uL (ref 4.14–5.80)
RDW: 13.2 % (ref 11.6–15.4)
WBC: 5.7 10*3/uL (ref 3.4–10.8)

## 2022-11-16 LAB — COMPREHENSIVE METABOLIC PANEL
ALT: 25 IU/L (ref 0–44)
AST: 21 IU/L (ref 0–40)
Albumin/Globulin Ratio: 1.5 (ref 1.2–2.2)
Albumin: 4.3 g/dL (ref 3.8–4.9)
Alkaline Phosphatase: 75 IU/L (ref 44–121)
BUN/Creatinine Ratio: 11 (ref 9–20)
BUN: 12 mg/dL (ref 6–24)
Bilirubin Total: 0.3 mg/dL (ref 0.0–1.2)
CO2: 22 mmol/L (ref 20–29)
Calcium: 9.2 mg/dL (ref 8.7–10.2)
Chloride: 102 mmol/L (ref 96–106)
Creatinine, Ser: 1.05 mg/dL (ref 0.76–1.27)
Globulin, Total: 2.8 g/dL (ref 1.5–4.5)
Glucose: 101 mg/dL — ABNORMAL HIGH (ref 70–99)
Potassium: 4 mmol/L (ref 3.5–5.2)
Sodium: 139 mmol/L (ref 134–144)
Total Protein: 7.1 g/dL (ref 6.0–8.5)
eGFR: 82 mL/min/{1.73_m2} (ref >59)

## 2022-11-16 LAB — HEMOGLOBIN A1C
Est. average glucose Bld gHb Est-mCnc: 126 mg/dL
Hgb A1c MFr Bld: 6 % — ABNORMAL HIGH (ref 4.8–5.6)

## 2022-11-16 MED ORDER — ROSUVASTATIN CALCIUM 40 MG PO TABS: 40.0000 mg | ORAL_TABLET | Freq: Every day | ORAL | 0 refills | Status: DC

## 2022-11-16 MED ORDER — BUPROPION HCL ER (XL) 150 MG PO TB24: 150.0000 mg | ORAL_TABLET | Freq: Every day | ORAL | 1 refills | Status: DC

## 2022-11-16 MED ORDER — LEVOCETIRIZINE DIHYDROCHLORIDE 5 MG PO TABS: 5.0000 mg | ORAL_TABLET | Freq: Every evening | ORAL | 1 refills | Status: DC

## 2022-11-16 NOTE — Assessment & Plan Note (Addendum)
Taking medication as prescribed before breakfast. Denies fatigue, cold intolerance, constipation, weight gain, memory issues, periorbital edema, dry skin. Reports changing diet and this is changing bowel movements. Last TSH on 8/22 normal.  Will get TSH, T4 today, will continue levothyroxine 50 mcg daily, will adjust based on lab results today if needed.  Tolerating medication well.  Well-controlled. Will send refills after labs result.

## 2022-11-16 NOTE — Progress Notes (Signed)
Established Patient Office Visit  Subjective   Patient ID: Ladarrius Bogdanski, male    DOB: 1961-03-25  Age: 62 y.o. MRN: [id number]  Chief Complaint  Patient presents with   Follow-up   Hypothyroidism   Hyperlipidemia    HPI Medication compliance: taking as prescribed Taking medication on empty stomach 60 minutes before breakfast: yes Denies fatigue, cold intolerance, constipation, weight gain, memory issues, periorbital edema, dry skin. Reports changing diet and this is changing bowel movements.  Pertinent lab results: TSH normal on 06/13/22 Current medication regimen: levothyroxine 50 mcg daily Well-controlled: yes Follow-up in 6 months for medication management.   Hyperlipidemia:  Medication compliance: taking as prescribed.  Denies chest pain, shortness of breath, lower extremity edema, myalgias, muscle weakness, changes in appearance of urine.  Pertinent lab work: last labs on 06/13/22 Monitoring: every 6 months Continue current medication regimen: Crestor 40 mg daily Follow-up: 6 months   To note:  Seeing cardiology today. Seeing spine doctor soon.   Review of Systems  Constitutional:  Negative for chills and fever.  Respiratory:  Negative for shortness of breath.   Cardiovascular:  Negative for chest pain.  Gastrointestinal:  Negative for abdominal pain, nausea and vomiting.      Objective:     BP 139/85 (BP Location: Left Arm, Patient Position: Sitting, Cuff Size: Large)   Pulse 66   Resp 18   Ht '5\' 7"'$  (1.702 m)   Wt 204 lb (92.5 kg)   SpO2 98%   BMI 31.95 kg/m    Physical Exam Vitals and nursing note reviewed.  Constitutional:      General: He is not in acute distress.    Appearance: Normal appearance.  Cardiovascular:     Rate and Rhythm: Normal rate and regular rhythm.     Heart sounds: Normal heart sounds.  Pulmonary:     Effort: Pulmonary effort is normal.     Breath sounds: Normal breath sounds.  Skin:    General: Skin is warm and  dry.  Neurological:     General: No focal deficit present.     Mental Status: Mental status is at baseline.      No results found for any visits on 11/16/22.    The 10-year ASCVD risk score (Arnett DK, et al., 2019) is: 9.2%    Assessment & Plan:   Problem List Items Addressed This Visit     Prediabetes   Relevant Orders   CBC with Differential/Platelet   Comprehensive metabolic panel   Hemoglobin A1c   GAD (generalized anxiety disorder) - Primary    Anxiety well-controlled.  Wishes to change medications today due to side effects.  Will start Wellbutrin XL 150 mg daily.  He will stop current treatment and transition to Wellbutrin.  Last GAD-7 score: 0.       Relevant Medications   buPROPion (WELLBUTRIN XL) 150 MG 24 hr tablet   Mixed hyperlipidemia    Taking medication as prescribed. Denies chest pain, shortness of breath, lower extremity edema, myalgias, muscle weakness, changes in appearance of urine.   Last lipid panel done on 06/13/2022 with elevated cholesterol, triglycerides, and LDL.  He is tolerating statin therapy.   Will get lipid panel today.  Continue rosuvastatin 40 mg daily.  Will make adjustments in medications based on lipid panel today if needed.  Recommend low sugar/low-carb diet with moderate intensity exercise.  Refill sent      Relevant Medications   rosuvastatin (CRESTOR) 40 MG tablet   Other Relevant  Orders   Lipid panel   Hypothyroidism    Taking medication as prescribed before breakfast. Denies fatigue, cold intolerance, constipation, weight gain, memory issues, periorbital edema, dry skin. Reports changing diet and this is changing bowel movements. Last TSH on 8/22 normal.  Will get TSH, T4 today, will continue levothyroxine 50 mcg daily, will adjust based on lab results today if needed.  Tolerating medication well.  Well-controlled.     Agrees with plan of care discussed.  Questions answered.   Return in about 3 months (around 02/15/2023) for  hypothyroid, HLD, anxiety.    Chalmers Guest, FNP

## 2022-11-16 NOTE — Assessment & Plan Note (Addendum)
Taking medication as prescribed. Denies chest pain, shortness of breath, lower extremity edema, myalgias, muscle weakness, changes in appearance of urine.   Last lipid panel done on 06/13/2022 with elevated cholesterol, triglycerides, and LDL.  He is tolerating statin therapy.   Will get lipid panel today.  Continue rosuvastatin 40 mg daily.  Will make adjustments in medications based on lipid panel today if needed.  Recommend low sugar/low-carb diet with moderate intensity exercise.  Refill sent

## 2022-11-16 NOTE — Patient Instructions (Signed)
Medication Instructions:  The current medical regimen is effective;  continue present plan and medications.  *If you need a refill on your cardiac medications before your next appointment, please call your pharmacy*   Follow-Up: At [facility], you and your health needs are our priority.  As part of our continuing mission to provide you with exceptional heart care, we have created designated Provider Care Teams.  These Care Teams include your primary Cardiologist (physician) and Advanced Practice Providers (APPs -  Physician Assistants and Nurse Practitioners) who all work together to provide you with the care you need, when you need it.  We recommend signing up for the patient portal called "MyChart".  Sign up information is provided on this After Visit Summary.  MyChart is used to connect with patients for Virtual Visits (Telemedicine).  Patients are able to view lab/test results, encounter notes, upcoming appointments, etc.  Non-urgent messages can be sent to your provider as well.   To learn more about what you can do with MyChart, go to [email or web address].    Your next appointment:   12 month(s)  Provider:   Callie Goodrich, PA-C, Hao Meng, PA-C, or Emily Monge, NP      

## 2022-11-16 NOTE — Assessment & Plan Note (Signed)
Anxiety well-controlled.  Wishes to change medications today due to side effects.  Will start Wellbutrin XL 150 mg daily.  He will stop current treatment and transition to Wellbutrin.  Last GAD-7 score: 0.

## 2022-11-17 DIAGNOSIS — M5451 Vertebrogenic low back pain: Secondary | ICD-10-CM | POA: Diagnosis not present

## 2022-11-17 DIAGNOSIS — M542 Cervicalgia: Secondary | ICD-10-CM | POA: Diagnosis not present

## 2022-11-17 DIAGNOSIS — M5459 Other low back pain: Secondary | ICD-10-CM | POA: Diagnosis not present

## 2022-11-19 ENCOUNTER — Other Ambulatory Visit (HOSPITAL_BASED_OUTPATIENT_CLINIC_OR_DEPARTMENT_OTHER): Payer: Self-pay | Admitting: Family Medicine

## 2022-11-24 DIAGNOSIS — M5416 Radiculopathy, lumbar region: Secondary | ICD-10-CM | POA: Diagnosis not present

## 2022-11-28 DIAGNOSIS — M542 Cervicalgia: Secondary | ICD-10-CM | POA: Diagnosis not present

## 2022-11-28 DIAGNOSIS — M5451 Vertebrogenic low back pain: Secondary | ICD-10-CM | POA: Diagnosis not present

## 2022-12-07 DIAGNOSIS — M5412 Radiculopathy, cervical region: Secondary | ICD-10-CM | POA: Diagnosis not present

## 2022-12-12 ENCOUNTER — Other Ambulatory Visit (HOSPITAL_BASED_OUTPATIENT_CLINIC_OR_DEPARTMENT_OTHER): Payer: Self-pay | Admitting: Family Medicine

## 2022-12-25 ENCOUNTER — Ambulatory Visit (HOSPITAL_BASED_OUTPATIENT_CLINIC_OR_DEPARTMENT_OTHER): Payer: Commercial Managed Care - HMO | Admitting: Family Medicine

## 2022-12-27 DIAGNOSIS — I872 Venous insufficiency (chronic) (peripheral): Secondary | ICD-10-CM | POA: Diagnosis not present

## 2022-12-27 DIAGNOSIS — I83893 Varicose veins of bilateral lower extremities with other complications: Secondary | ICD-10-CM | POA: Diagnosis not present

## 2022-12-27 DIAGNOSIS — R252 Cramp and spasm: Secondary | ICD-10-CM | POA: Diagnosis not present

## 2022-12-27 DIAGNOSIS — M79661 Pain in right lower leg: Secondary | ICD-10-CM | POA: Diagnosis not present

## 2022-12-27 DIAGNOSIS — M79662 Pain in left lower leg: Secondary | ICD-10-CM | POA: Diagnosis not present

## 2023-01-09 DIAGNOSIS — M542 Cervicalgia: Secondary | ICD-10-CM | POA: Diagnosis not present

## 2023-01-16 ENCOUNTER — Other Ambulatory Visit (HOSPITAL_BASED_OUTPATIENT_CLINIC_OR_DEPARTMENT_OTHER): Payer: Self-pay | Admitting: Family Medicine

## 2023-01-16 DIAGNOSIS — F411 Generalized anxiety disorder: Secondary | ICD-10-CM

## 2023-01-23 DIAGNOSIS — M5412 Radiculopathy, cervical region: Secondary | ICD-10-CM | POA: Diagnosis not present

## 2023-02-05 DIAGNOSIS — G4733 Obstructive sleep apnea (adult) (pediatric): Secondary | ICD-10-CM | POA: Diagnosis not present

## 2023-02-10 ENCOUNTER — Other Ambulatory Visit (HOSPITAL_BASED_OUTPATIENT_CLINIC_OR_DEPARTMENT_OTHER): Payer: Self-pay | Admitting: Family Medicine

## 2023-02-10 DIAGNOSIS — F411 Generalized anxiety disorder: Secondary | ICD-10-CM

## 2023-02-11 ENCOUNTER — Other Ambulatory Visit (HOSPITAL_BASED_OUTPATIENT_CLINIC_OR_DEPARTMENT_OTHER): Payer: Self-pay | Admitting: Family Medicine

## 2023-02-13 ENCOUNTER — Other Ambulatory Visit (HOSPITAL_BASED_OUTPATIENT_CLINIC_OR_DEPARTMENT_OTHER): Payer: Self-pay

## 2023-02-13 DIAGNOSIS — F411 Generalized anxiety disorder: Secondary | ICD-10-CM

## 2023-02-13 MED ORDER — BUPROPION HCL ER (XL) 150 MG PO TB24: 150.0000 mg | ORAL_TABLET | Freq: Every day | ORAL | 3 refills | Status: DC

## 2023-02-19 ENCOUNTER — Ambulatory Visit (HOSPITAL_BASED_OUTPATIENT_CLINIC_OR_DEPARTMENT_OTHER): Payer: 59 | Admitting: Family Medicine

## 2023-03-14 ENCOUNTER — Other Ambulatory Visit (HOSPITAL_BASED_OUTPATIENT_CLINIC_OR_DEPARTMENT_OTHER): Payer: Self-pay

## 2023-03-14 DIAGNOSIS — E782 Mixed hyperlipidemia: Secondary | ICD-10-CM

## 2023-03-14 MED ORDER — ROSUVASTATIN CALCIUM 40 MG PO TABS: 40.0000 mg | ORAL_TABLET | Freq: Every day | ORAL | 0 refills | Status: DC

## 2023-04-10 ENCOUNTER — Other Ambulatory Visit (HOSPITAL_BASED_OUTPATIENT_CLINIC_OR_DEPARTMENT_OTHER): Payer: Self-pay | Admitting: Family Medicine

## 2023-05-03 ENCOUNTER — Other Ambulatory Visit (HOSPITAL_BASED_OUTPATIENT_CLINIC_OR_DEPARTMENT_OTHER): Payer: Self-pay | Admitting: Family Medicine

## 2023-05-03 DIAGNOSIS — E782 Mixed hyperlipidemia: Secondary | ICD-10-CM

## 2023-05-07 DIAGNOSIS — G4733 Obstructive sleep apnea (adult) (pediatric): Secondary | ICD-10-CM | POA: Diagnosis not present

## 2023-05-25 ENCOUNTER — Other Ambulatory Visit (HOSPITAL_BASED_OUTPATIENT_CLINIC_OR_DEPARTMENT_OTHER): Payer: Self-pay | Admitting: Family Medicine

## 2023-05-25 DIAGNOSIS — E782 Mixed hyperlipidemia: Secondary | ICD-10-CM

## 2023-05-28 ENCOUNTER — Ambulatory Visit: Payer: 59 | Admitting: Internal Medicine

## 2023-05-28 ENCOUNTER — Encounter: Payer: Self-pay | Admitting: Internal Medicine

## 2023-05-28 VITALS — BP 110/70 | HR 70 | Temp 98.2°F | Wt 191.8 lb

## 2023-05-28 DIAGNOSIS — M153 Secondary multiple arthritis: Secondary | ICD-10-CM | POA: Diagnosis not present

## 2023-05-28 DIAGNOSIS — R413 Other amnesia: Secondary | ICD-10-CM

## 2023-05-28 LAB — CBC WITH DIFFERENTIAL/PLATELET
Basophils Absolute: 0 10*3/uL (ref 0.0–0.1)
Basophils Relative: 0.6 % (ref 0.0–3.0)
Eosinophils Absolute: 0.1 10*3/uL (ref 0.0–0.7)
Eosinophils Relative: 1.2 % (ref 0.0–5.0)
HCT: 43.4 % (ref 39.0–52.0)
Hemoglobin: 14.1 g/dL (ref 13.0–17.0)
Lymphocytes Relative: 41.1 % (ref 12.0–46.0)
Lymphs Abs: 2.2 10*3/uL (ref 0.7–4.0)
MCHC: 32.5 g/dL (ref 30.0–36.0)
MCV: 94.4 fl (ref 78.0–100.0)
Monocytes Absolute: 0.4 10*3/uL (ref 0.1–1.0)
Monocytes Relative: 7.5 % (ref 3.0–12.0)
Neutro Abs: 2.7 10*3/uL (ref 1.4–7.7)
Neutrophils Relative %: 49.6 % (ref 43.0–77.0)
Platelets: 135 10*3/uL — ABNORMAL LOW (ref 150.0–400.0)
RBC: 4.6 Mil/uL (ref 4.22–5.81)
RDW: 13.6 % (ref 11.5–15.5)
WBC: 5.5 10*3/uL (ref 4.0–10.5)

## 2023-05-28 LAB — COMPREHENSIVE METABOLIC PANEL
ALT: 21 U/L (ref 0–53)
AST: 21 U/L (ref 0–37)
Albumin: 4.5 g/dL (ref 3.5–5.2)
Alkaline Phosphatase: 65 U/L (ref 39–117)
BUN: 16 mg/dL (ref 6–23)
CO2: 28 mEq/L (ref 19–32)
Calcium: 9.3 mg/dL (ref 8.4–10.5)
Chloride: 102 mEq/L (ref 96–112)
Creatinine, Ser: 1.16 mg/dL (ref 0.40–1.50)
GFR: 68.66 mL/min (ref >60.00)
Glucose, Bld: 96 mg/dL (ref 70–99)
Potassium: 4.4 mEq/L (ref 3.5–5.1)
Sodium: 138 mEq/L (ref 135–145)
Total Bilirubin: 0.4 mg/dL (ref 0.2–1.2)
Total Protein: 7.3 g/dL (ref 6.0–8.3)

## 2023-05-28 LAB — TSH: TSH: 3.85 u[IU]/mL (ref 0.35–5.50)

## 2023-05-28 LAB — VITAMIN D 25 HYDROXY (VIT D DEFICIENCY, FRACTURES): VITD: 30.51 ng/mL (ref 30.00–100.00)

## 2023-05-28 LAB — VITAMIN B12: Vitamin B-12: 945 pg/mL — ABNORMAL HIGH (ref 211–911)
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
## 2023-05-29 ENCOUNTER — Other Ambulatory Visit: Payer: Self-pay | Admitting: Family Medicine

## 2023-05-29 ENCOUNTER — Other Ambulatory Visit (HOSPITAL_BASED_OUTPATIENT_CLINIC_OR_DEPARTMENT_OTHER): Payer: Self-pay | Admitting: Family Medicine

## 2023-05-29 DIAGNOSIS — E782 Mixed hyperlipidemia: Secondary | ICD-10-CM

## 2023-06-11 ENCOUNTER — Other Ambulatory Visit: Payer: Self-pay | Admitting: *Deleted

## 2023-06-11 DIAGNOSIS — E782 Mixed hyperlipidemia: Secondary | ICD-10-CM

## 2023-06-11 MED ORDER — TRAZODONE HCL 50 MG PO TABS: ORAL_TABLET | ORAL | 3 refills | Status: DC

## 2023-06-11 MED ORDER — ROSUVASTATIN CALCIUM 40 MG PO TABS
40.0000 mg | ORAL_TABLET | Freq: Every day | ORAL | 1 refills | Status: DC
Start: 2023-06-11 — End: 2023-11-26

## 2023-07-04 ENCOUNTER — Other Ambulatory Visit: Payer: Self-pay | Admitting: Internal Medicine

## 2023-08-21 DIAGNOSIS — M25511 Pain in right shoulder: Secondary | ICD-10-CM | POA: Diagnosis not present

## 2023-08-27 ENCOUNTER — Ambulatory Visit: Payer: 59 | Admitting: Neurology

## 2023-08-27 ENCOUNTER — Telehealth: Payer: Self-pay | Admitting: Neurology

## 2023-08-27 ENCOUNTER — Encounter: Payer: Self-pay | Admitting: Neurology

## 2023-08-27 VITALS — BP 134/74 | HR 75 | Ht 67.0 in | Wt 197.0 lb

## 2023-08-27 DIAGNOSIS — R413 Other amnesia: Secondary | ICD-10-CM

## 2023-08-27 DIAGNOSIS — F439 Reaction to severe stress, unspecified: Secondary | ICD-10-CM

## 2023-08-27 DIAGNOSIS — Z818 Family history of other mental and behavioral disorders: Secondary | ICD-10-CM | POA: Diagnosis not present

## 2023-08-27 DIAGNOSIS — G8929 Other chronic pain: Secondary | ICD-10-CM

## 2023-08-27 DIAGNOSIS — G4733 Obstructive sleep apnea (adult) (pediatric): Secondary | ICD-10-CM | POA: Diagnosis not present

## 2023-08-27 DIAGNOSIS — G4700 Insomnia, unspecified: Secondary | ICD-10-CM

## 2023-08-27 DIAGNOSIS — R4789 Other speech disturbances: Secondary | ICD-10-CM | POA: Diagnosis not present

## 2023-08-27 NOTE — Telephone Encounter (Signed)
sent to [facility] they obtain [employer] auth [phone] 
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
## 2023-08-27 NOTE — Patient Instructions (Addendum)
It was nice to see you again.  You have complaints of memory loss: memory loss or changes in cognitive function can have many reasons and does not always mean you have dementia.  There are several conditions and situations that can contribute to subjective or objective memory loss.  These factors include: depression, stress, sleep deprivation or poor sleep from insomnia or sleep apnea, dehydration, fluctuation in blood sugar values, thyroid or electrolyte dysfunction, medication effects from sedating medications or narcotic pain medication for example and certain vitamin deficiencies such as vitamin B12 deficiency, and anemia. Dementia can be caused by stroke, brain atherosclerosis or brain vascular disease due to vascular risk factors (smoking, high blood pressure, high cholesterol, obesity and uncontrolled diabetes), certain degenerative brain disorders (including Parkinson's disease and Multiple sclerosis) and by Alzheimer's disease or other, more rare and sometimes hereditary causes.   Here is what I would recommend:   Blood work (which has been done by your primary care doctor already).  We will do a brain scan, called MRI and call you with the test results. We will have to schedule you for this on a separate date. This test requires authorization from your insurance, and we will take care of the insurance process. Please use your autoPAP regularly, try to use it all night, every night.  You have a history of severe obstructive sleep apnea which can cause complications including memory loss. While your insurance requires that you use PAP at least 4 hours each night on 70% of the nights, I recommend, that you not skip any nights and use it throughout the night if you can. Getting used to PAP and staying with the treatment long term does take time and patience and discipline. Untreated obstructive sleep apnea when it is moderate to severe can have an adverse impact on cardiovascular health and raise her  risk for heart disease, arrhythmias, hypertension, congestive heart failure, stroke and diabetes. Untreated obstructive sleep apnea causes sleep disruption, nonrestorative sleep, and sleep deprivation. This can have an impact on your day to day functioning and cause daytime sleepiness and impairment of cognitive function, memory loss, mood disturbance, and problems focussing. Using PAP regularly can improve these symptoms. I would like to refer you for formal cognitive test called neuropsychological evaluation which is done by a licensed neuropsychologist. We will make a referral in that regard.  We will keep you posted as to your test results by phone call for now.  We will plan a follow-up after testing. Please follow-up with your PCP regarding your chronic insomnia and stress as well as chronic pain.  You may benefit from seeing a psychiatrist and/or a psychologist for stress management and chronic insomnia.  Please follow-up with your orthopedics as scheduled/planned.  Addressing chronic pain may help your sleep.  Sleeping better may help your memory, treating your sleep apnea may also help your memory long-term. Please talk to your PCP about starting vitamin D.  I recommend you start an over-the-counter supplement especially with the winter months coming and your vitamin D level was borderline low. We will plan to follow-up in this clinic in 3 to 4 months.

## 2023-09-04 ENCOUNTER — Encounter: Payer: Self-pay | Admitting: Internal Medicine

## 2023-09-04 ENCOUNTER — Ambulatory Visit (INDEPENDENT_AMBULATORY_CARE_PROVIDER_SITE_OTHER): Payer: 59 | Admitting: Internal Medicine

## 2023-09-04 VITALS — BP 110/70 | HR 76 | Temp 98.7°F | Ht 66.5 in | Wt 195.7 lb

## 2023-09-04 DIAGNOSIS — E785 Hyperlipidemia, unspecified: Secondary | ICD-10-CM

## 2023-09-04 DIAGNOSIS — Z0000 Encounter for general adult medical examination without abnormal findings: Secondary | ICD-10-CM

## 2023-09-04 DIAGNOSIS — H539 Unspecified visual disturbance: Secondary | ICD-10-CM

## 2023-09-04 DIAGNOSIS — Z125 Encounter for screening for malignant neoplasm of prostate: Secondary | ICD-10-CM | POA: Diagnosis not present

## 2023-09-04 DIAGNOSIS — R7303 Prediabetes: Secondary | ICD-10-CM | POA: Diagnosis not present

## 2023-09-04 DIAGNOSIS — E291 Testicular hypofunction: Secondary | ICD-10-CM

## 2023-09-04 DIAGNOSIS — E039 Hypothyroidism, unspecified: Secondary | ICD-10-CM

## 2023-09-04 DIAGNOSIS — F5101 Primary insomnia: Secondary | ICD-10-CM

## 2023-09-04 LAB — LIPID PANEL
Cholesterol: 132 mg/dL (ref 0–200)
HDL: 55.1 mg/dL (ref >39.00)
LDL Cholesterol: 36 mg/dL (ref 0–99)
NonHDL: 76.42
Total CHOL/HDL Ratio: 2
Triglycerides: 203 mg/dL — ABNORMAL HIGH (ref 0.0–149.0)
VLDL: 40.6 mg/dL — ABNORMAL HIGH (ref 0.0–40.0)

## 2023-09-04 LAB — HEMOGLOBIN A1C: Hgb A1c MFr Bld: 6.2 % (ref 4.6–6.5)

## 2023-09-04 LAB — PSA: PSA: 2.13 ng/mL (ref 0.10–4.00)

## 2023-09-04 LAB — VITAMIN D 25 HYDROXY (VIT D DEFICIENCY, FRACTURES): VITD: 29.1 ng/mL — ABNORMAL LOW (ref 30.00–100.00)

## 2023-09-04 MED ORDER — ESZOPICLONE 1 MG PO TABS: 1.0000 mg | ORAL_TABLET | Freq: Every evening | ORAL | 0 refills | Status: DC | PRN

## 2023-09-04 NOTE — Addendum Note (Signed)
Addended by: Kern Reap B on: 09/04/2023 10:14 AM   Modules accepted: Orders
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
## 2023-09-05 ENCOUNTER — Other Ambulatory Visit: Payer: Self-pay | Admitting: Internal Medicine

## 2023-09-05 ENCOUNTER — Encounter: Payer: Self-pay | Admitting: Internal Medicine

## 2023-09-05 DIAGNOSIS — R7303 Prediabetes: Secondary | ICD-10-CM

## 2023-09-05 DIAGNOSIS — E559 Vitamin D deficiency, unspecified: Secondary | ICD-10-CM | POA: Insufficient documentation

## 2023-09-05 MED ORDER — VITAMIN D (ERGOCALCIFEROL) 1.25 MG (50000 UNIT) PO CAPS: 50000.0000 [IU] | ORAL_CAPSULE | ORAL | 0 refills | Status: DC

## 2023-09-07 ENCOUNTER — Encounter: Payer: Self-pay | Admitting: Neurology

## 2023-09-10 DIAGNOSIS — M25511 Pain in right shoulder: Secondary | ICD-10-CM | POA: Diagnosis not present

## 2023-09-12 ENCOUNTER — Other Ambulatory Visit: Payer: Self-pay | Admitting: Neurology

## 2023-09-12 ENCOUNTER — Other Ambulatory Visit: Payer: Self-pay | Admitting: *Deleted

## 2023-09-12 ENCOUNTER — Ambulatory Visit
Admission: RE | Admit: 2023-09-12 | Discharge: 2023-09-12 | Disposition: A | Discharge status: Home / Self Care | Payer: 59 | Source: Ambulatory Visit | Attending: Neurology | Admitting: Neurology

## 2023-09-12 DIAGNOSIS — F439 Reaction to severe stress, unspecified: Secondary | ICD-10-CM

## 2023-09-12 DIAGNOSIS — G4733 Obstructive sleep apnea (adult) (pediatric): Secondary | ICD-10-CM

## 2023-09-12 DIAGNOSIS — R413 Other amnesia: Secondary | ICD-10-CM

## 2023-09-12 DIAGNOSIS — R4789 Other speech disturbances: Secondary | ICD-10-CM

## 2023-09-12 DIAGNOSIS — G4700 Insomnia, unspecified: Secondary | ICD-10-CM | POA: Diagnosis not present

## 2023-09-12 DIAGNOSIS — G8929 Other chronic pain: Secondary | ICD-10-CM | POA: Diagnosis not present

## 2023-09-12 DIAGNOSIS — E559 Vitamin D deficiency, unspecified: Secondary | ICD-10-CM

## 2023-09-12 DIAGNOSIS — Z818 Family history of other mental and behavioral disorders: Secondary | ICD-10-CM

## 2023-09-12 MED ORDER — GADOPICLENOL 0.5 MMOL/ML IV SOLN: 9.0000 mL | Freq: Once | INTRAVENOUS | Status: DC | PRN

## 2023-09-17 DIAGNOSIS — M7541 Impingement syndrome of right shoulder: Secondary | ICD-10-CM | POA: Diagnosis not present

## 2023-10-02 DIAGNOSIS — H5712 Ocular pain, left eye: Secondary | ICD-10-CM | POA: Diagnosis not present

## 2023-10-02 DIAGNOSIS — H2513 Age-related nuclear cataract, bilateral: Secondary | ICD-10-CM | POA: Diagnosis not present

## 2023-10-02 DIAGNOSIS — H43813 Vitreous degeneration, bilateral: Secondary | ICD-10-CM | POA: Diagnosis not present

## 2023-10-02 DIAGNOSIS — R7309 Other abnormal glucose: Secondary | ICD-10-CM | POA: Diagnosis not present

## 2023-10-21 ENCOUNTER — Other Ambulatory Visit: Payer: Self-pay | Admitting: Internal Medicine

## 2023-10-21 DIAGNOSIS — E559 Vitamin D deficiency, unspecified: Secondary | ICD-10-CM

## 2023-11-18 NOTE — Progress Notes (Unsigned)
Cardiology Office Note:  .   Date:  11/20/2023  ID:  Ralph Phillips, DOB Jan 16, 1961, MRN [id number] PCP: Philip Aspen, Limmie Patricia, MD  [facility] Providers Cardiologist:  None { History of Present Illness: Ralph Phillips   Ralph Phillips is a 63 y.o. male with history of nonobstructive CAD who presents for follow-up.    History of Present Illness   The patient is a 63 year old male with nonobstructive CAD and hyperlipidemia who presents for follow-up.  He has a history of nonobstructive coronary artery disease (CAD) and hyperlipidemia. He is currently taking Crestor 40 mg daily for hyperlipidemia, with his most recent LDL cholesterol at goal. No chest pain or trouble breathing related to his CAD, and his previous CT scan showed no blockages, only calcium deposits.  He experiences upper respiratory symptoms, including a persistent cough and congestion since having COVID. He notes shortness of breath and chest pain, particularly when coughing or sneezing, which he attributes to a recent flu. He also mentions diminished lung capacity and frequent expectoration, even when not sick.  He is scheduled for shoulder surgery next week to remove bone spurs and correct bone malformations on the right shoulder. He previously had similar surgery on the left shoulder last year.  He reports insomnia, for which he takes trazodone and melatonin, which help him sleep after about two hours.          Problem List CAD -CAC score 339 (92nd percentile) -<25% stenosis  2. HLD -T chol 132, HDL 55, LDL 36, TG 203    ROS: All other ROS reviewed and negative. Pertinent positives noted in the HPI.     Studies Reviewed: Ralph Phillips   EKG Interpretation Date/Time:  Tuesday November 20 2023 13:05:21 EST Ventricular Rate:  83 PR Interval:  148 QRS Duration:  86 QT Interval:  368 QTC Calculation: 432 R Axis:   51  Text Interpretation: Normal sinus rhythm Normal ECG Confirmed by Lennie Odor [phone]) on  11/20/2023 1:07:09 PM   Physical Exam:   VS:  BP 128/80 (BP Location: Right Arm, Patient Position: Sitting, Cuff Size: Normal)   Pulse 83   Ht 5\' 7"  (1.702 m)   Wt 194 lb (88 kg)   BMI 30.38 kg/m    Wt Readings from Last 3 Encounters:  11/20/23 194 lb (88 kg)  09/04/23 195 lb 11.2 oz (88.8 kg)  08/27/23 197 lb (89.4 kg)    GEN: Well nourished, well developed in no acute distress NECK: No JVD; No carotid bruits CARDIAC: RRR, no murmurs, rubs, gallops RESPIRATORY:  Clear to auscultation without rales, wheezing or rhonchi  ABDOMEN: Soft, non-tender, non-distended EXTREMITIES:  No edema; No deformity  ASSESSMENT AND PLAN: .   Assessment and Plan    Nonobstructive CAD No acute ischemic changes on EKG. No chest pain or dyspnea suggestive of cardiac etiology. Recent upper respiratory infection likely contributing to current symptoms. -Continue current management.  Hyperlipidemia LDL at goal on Crestor 40mg  daily. -Continue Crestor 40mg  daily.  Preoperative evaluation for shoulder surgery Surgery scheduled with Dr. Rennis Chris. No new cardiac symptoms or changes on EKG. -ok to proceed to surgery.  -Hold aspirin as per surgical instructions.  Upper respiratory symptoms Reports cough and congestion since recent COVID infection. -Symptomatic management.  Follow-up in 1 year.              Follow-up: Return in about 1 year (around 11/19/2024).  Signed, Lenna Gilford. Flora Lipps, MD, [facility]  [facility]  [address], [address] [place], [place] [address] [phone]  1:48 PM

## 2023-11-20 ENCOUNTER — Encounter: Payer: Self-pay | Admitting: Cardiovascular Disease

## 2023-11-20 ENCOUNTER — Ambulatory Visit: Payer: 59 | Attending: Cardiovascular Disease | Admitting: Cardiovascular Disease

## 2023-11-20 VITALS — BP 128/80 | HR 83 | Ht 67.0 in | Wt 194.0 lb

## 2023-11-20 DIAGNOSIS — E559 Vitamin D deficiency, unspecified: Secondary | ICD-10-CM | POA: Diagnosis not present

## 2023-11-20 DIAGNOSIS — Z01810 Encounter for preprocedural cardiovascular examination: Secondary | ICD-10-CM | POA: Diagnosis not present

## 2023-11-20 DIAGNOSIS — E782 Mixed hyperlipidemia: Secondary | ICD-10-CM

## 2023-11-20 DIAGNOSIS — I2510 Atherosclerotic heart disease of native coronary artery without angina pectoris: Secondary | ICD-10-CM

## 2023-11-20 MED ORDER — ADULT MULTIVITAMIN W/MINERALS CH: 1.0000 | ORAL_TABLET | Freq: Every day | ORAL | 3 refills | Status: DC

## 2023-11-20 MED ORDER — VITAMIN D (ERGOCALCIFEROL) 1.25 MG (50000 UNIT) PO CAPS: 50000.0000 [IU] | ORAL_CAPSULE | ORAL | 0 refills | Status: AC

## 2023-11-20 NOTE — Patient Instructions (Signed)

## 2023-11-24 ENCOUNTER — Other Ambulatory Visit: Payer: Self-pay | Admitting: Internal Medicine

## 2023-11-24 DIAGNOSIS — E782 Mixed hyperlipidemia: Secondary | ICD-10-CM

## 2023-11-28 ENCOUNTER — Ambulatory Visit: Payer: 59 | Admitting: Neurology

## 2023-11-29 ENCOUNTER — Ambulatory Visit: Payer: 59 | Admitting: Neurology

## 2023-11-30 DIAGNOSIS — S43431A Superior glenoid labrum lesion of right shoulder, initial encounter: Secondary | ICD-10-CM | POA: Diagnosis not present

## 2023-11-30 DIAGNOSIS — M19011 Primary osteoarthritis, right shoulder: Secondary | ICD-10-CM | POA: Diagnosis not present

## 2023-11-30 DIAGNOSIS — M24111 Other articular cartilage disorders, right shoulder: Secondary | ICD-10-CM | POA: Diagnosis not present

## 2023-11-30 DIAGNOSIS — G8918 Other acute postprocedural pain: Secondary | ICD-10-CM | POA: Diagnosis not present

## 2023-11-30 DIAGNOSIS — M7541 Impingement syndrome of right shoulder: Secondary | ICD-10-CM | POA: Diagnosis not present

## 2023-11-30 DIAGNOSIS — M75111 Incomplete rotator cuff tear or rupture of right shoulder, not specified as traumatic: Secondary | ICD-10-CM | POA: Diagnosis not present

## 2023-12-25 DIAGNOSIS — M5416 Radiculopathy, lumbar region: Secondary | ICD-10-CM | POA: Diagnosis not present

## 2023-12-25 DIAGNOSIS — M5412 Radiculopathy, cervical region: Secondary | ICD-10-CM | POA: Diagnosis not present

## 2024-01-01 DIAGNOSIS — M6281 Muscle weakness (generalized): Secondary | ICD-10-CM | POA: Diagnosis not present

## 2024-01-01 DIAGNOSIS — M25611 Stiffness of right shoulder, not elsewhere classified: Secondary | ICD-10-CM | POA: Diagnosis not present

## 2024-01-01 DIAGNOSIS — M25511 Pain in right shoulder: Secondary | ICD-10-CM | POA: Diagnosis not present

## 2024-01-08 DIAGNOSIS — M5416 Radiculopathy, lumbar region: Secondary | ICD-10-CM | POA: Diagnosis not present

## 2024-01-20 ENCOUNTER — Other Ambulatory Visit (HOSPITAL_BASED_OUTPATIENT_CLINIC_OR_DEPARTMENT_OTHER): Payer: Self-pay | Admitting: Family Medicine

## 2024-01-20 DIAGNOSIS — F411 Generalized anxiety disorder: Secondary | ICD-10-CM

## 2024-02-14 ENCOUNTER — Other Ambulatory Visit: Payer: Self-pay | Admitting: Cardiovascular Disease

## 2024-02-14 DIAGNOSIS — E559 Vitamin D deficiency, unspecified: Secondary | ICD-10-CM

## 2024-03-03 DIAGNOSIS — Z008 Encounter for other general examination: Secondary | ICD-10-CM | POA: Diagnosis not present

## 2024-03-10 ENCOUNTER — Other Ambulatory Visit: Payer: Self-pay | Admitting: Internal Medicine

## 2024-03-10 ENCOUNTER — Other Ambulatory Visit: Payer: Self-pay | Admitting: Cardiovascular Disease

## 2024-03-10 ENCOUNTER — Other Ambulatory Visit (HOSPITAL_BASED_OUTPATIENT_CLINIC_OR_DEPARTMENT_OTHER): Payer: Self-pay | Admitting: Family Medicine

## 2024-03-10 DIAGNOSIS — E559 Vitamin D deficiency, unspecified: Secondary | ICD-10-CM

## 2024-03-12 NOTE — Telephone Encounter (Signed)
 Dr. Edsel Grace Pt. As this is not a Cardiac RX, does Dr. Rolm Clos want to refill? Please advise.

## 2024-03-31 DIAGNOSIS — Z4789 Encounter for other orthopedic aftercare: Secondary | ICD-10-CM | POA: Diagnosis not present

## 2024-04-08 ENCOUNTER — Telehealth: Payer: Self-pay | Admitting: Internal Medicine

## 2024-04-08 NOTE — Telephone Encounter (Signed)
 Copied from CRM [id number]. Topic: Medical Record Request - Other >> Apr 08, 2024  3:49 PM Chuck Crater wrote: Reason for CRM: Adriana Hopping A wants to know if we recieved records request that was faxed on 06/13.

## 2024-04-17 NOTE — Telephone Encounter (Signed)
 Medication not listed on patient med list and not prescribed by cardiology.

## 2024-04-28 ENCOUNTER — Telehealth: Payer: Self-pay

## 2024-04-28 NOTE — Telephone Encounter (Signed)
 Copied from CRM [id number]. Topic: Clinical - Medication Question >> Apr 28, 2024  2:56 PM Chiquita SQUIBB wrote: Reason for CRM: Patient is requesting a refill on his Trazodone  50 MG, patient stated the original prescriber is Dr. Jodie, but him and Dr. Theophilus have spoke about this medication before.

## 2024-04-29 MED ORDER — TRAZODONE HCL 50 MG PO TABS
ORAL_TABLET | ORAL | 1 refills | Status: DC
Start: 2024-04-29 — End: 2024-05-26

## 2024-04-29 NOTE — Addendum Note (Signed)
 Addended by: KATHRYNE MILLMAN B on: 04/29/2024 04:05 PM   Modules accepted: Orders

## 2024-04-29 NOTE — Telephone Encounter (Signed)
 Attempted to call but the voicemail is not set up.

## 2024-04-29 NOTE — Telephone Encounter (Signed)
 Spoke to the patient and he is taking trazadone 50 mg 3 tablets at bedtime.

## 2024-05-01 ENCOUNTER — Encounter: Payer: Self-pay | Admitting: Internal Medicine

## 2024-05-01 ENCOUNTER — Ambulatory Visit: Admitting: Internal Medicine

## 2024-05-01 VITALS — BP 120/80 | HR 88 | Temp 98.1°F | Resp 17 | Ht 67.0 in | Wt 193.3 lb

## 2024-05-01 DIAGNOSIS — F5101 Primary insomnia: Secondary | ICD-10-CM

## 2024-05-01 DIAGNOSIS — K219 Gastro-esophageal reflux disease without esophagitis: Secondary | ICD-10-CM

## 2024-05-01 MED ORDER — PANTOPRAZOLE SODIUM 40 MG PO TBEC: 40.0000 mg | DELAYED_RELEASE_TABLET | Freq: Every day | ORAL | 1 refills | Status: DC
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
## 2024-05-07 DIAGNOSIS — M65941 Unspecified synovitis and tenosynovitis, right hand: Secondary | ICD-10-CM | POA: Diagnosis not present

## 2024-05-07 DIAGNOSIS — M65942 Unspecified synovitis and tenosynovitis, left hand: Secondary | ICD-10-CM | POA: Diagnosis not present

## 2024-05-07 DIAGNOSIS — M7712 Lateral epicondylitis, left elbow: Secondary | ICD-10-CM | POA: Diagnosis not present

## 2024-05-19 ENCOUNTER — Other Ambulatory Visit: Payer: Self-pay | Admitting: Internal Medicine

## 2024-05-19 DIAGNOSIS — E782 Mixed hyperlipidemia: Secondary | ICD-10-CM

## 2024-05-23 ENCOUNTER — Other Ambulatory Visit: Payer: Self-pay | Admitting: Internal Medicine

## 2024-05-23 DIAGNOSIS — F411 Generalized anxiety disorder: Secondary | ICD-10-CM

## 2024-05-23 NOTE — Telephone Encounter (Signed)
 Copied from CRM #[id number]. Topic: Clinical - Medication Refill >> May 23, 2024  4:20 PM Shereese L wrote: Medication:metFORMIN (GLUCOPHAGE) 500 MG tablet  Has the patient contacted their pharmacy? Yes (Agent: If no, request that the patient contact the pharmacy for the refill. If patient does not wish to contact the pharmacy document the reason why and proceed with request.) (Agent: If yes, when and what did the pharmacy advise?)  This is the patient's preferred pharmacy:  [id number] [address], [place] - [address] [place] [address] Phone: [phone] Fax: [phone]  Is this the correct pharmacy for this prescription? Yes If no, delete pharmacy and type the correct one.   Has the prescription been filled recently? Yes  Is the patient out of the medication? Yes  Has the patient been seen for an appointment in the last year OR does the patient have an upcoming appointment? Yes  Can we respond through MyChart? Yes  Agent: Please be advised that Rx refills may take up to 3 business days. We ask that you follow-up with your pharmacy.

## 2024-06-02 DIAGNOSIS — M7712 Lateral epicondylitis, left elbow: Secondary | ICD-10-CM | POA: Diagnosis not present

## 2024-06-02 DIAGNOSIS — M79641 Pain in right hand: Secondary | ICD-10-CM | POA: Diagnosis not present

## 2024-06-05 DIAGNOSIS — M5412 Radiculopathy, cervical region: Secondary | ICD-10-CM | POA: Diagnosis not present

## 2024-06-06 NOTE — Telephone Encounter (Unsigned)
 Copied from CRM #[id number]. Topic: Clinical - Medication Refill >> Jun 06, 2024  3:31 PM Harlene ORN wrote: Medication: buPROPion  (WELLBUTRIN  XL) 150 MG 24 hr tablet, metFORMIN  (GLUCOPHAGE ) 500 MG tablet  Has the patient contacted their pharmacy? Yes (Agent: If no, request that the patient contact the pharmacy for the refill. If patient does not wish to contact the pharmacy document the reason why and proceed with request.) (Agent: If yes, when and what did the pharmacy advise?)  This is the patient's preferred pharmacy:  [id number] [address], [place] - [address] [place] [address] Phone: [phone] Fax: [phone]  Is this the correct pharmacy for this prescription? Yes If no, delete pharmacy and type the correct one.   Has the prescription been filled recently? No  Is the patient out of the medication? Yes  Has the patient been seen for an appointment in the last year OR does the patient have an upcoming appointment? Yes  Can we respond through MyChart? Yes  Agent: Please be advised that Rx refills may take up to 3 business days. We ask that you follow-up with your pharmacy.

## 2024-06-12 DIAGNOSIS — M5412 Radiculopathy, cervical region: Secondary | ICD-10-CM | POA: Diagnosis not present

## 2024-06-12 DIAGNOSIS — M5416 Radiculopathy, lumbar region: Secondary | ICD-10-CM | POA: Diagnosis not present

## 2024-06-12 MED ORDER — METFORMIN HCL 500 MG PO TABS: 500.0000 mg | ORAL_TABLET | Freq: Every day | ORAL | 1 refills | Status: DC

## 2024-06-12 MED ORDER — BUPROPION HCL ER (XL) 150 MG PO TB24: 150.0000 mg | ORAL_TABLET | Freq: Every day | ORAL | 1 refills | Status: AC

## 2024-06-12 NOTE — Addendum Note (Signed)
 Addended by: KATHRYNE MILLMAN B on: 06/12/2024 03:09 PM   Modules accepted: Orders

## 2024-06-16 DIAGNOSIS — Z820 Family history of epilepsy and other diseases of the nervous system: Secondary | ICD-10-CM | POA: Diagnosis not present

## 2024-06-16 DIAGNOSIS — M5030 Other cervical disc degeneration, unspecified cervical region: Secondary | ICD-10-CM | POA: Diagnosis not present

## 2024-06-16 DIAGNOSIS — M51369 Other intervertebral disc degeneration, lumbar region without mention of lumbar back pain or lower extremity pain: Secondary | ICD-10-CM | POA: Diagnosis not present

## 2024-06-16 DIAGNOSIS — G4733 Obstructive sleep apnea (adult) (pediatric): Secondary | ICD-10-CM | POA: Diagnosis not present

## 2024-06-16 DIAGNOSIS — E785 Hyperlipidemia, unspecified: Secondary | ICD-10-CM | POA: Diagnosis not present

## 2024-06-16 DIAGNOSIS — G4752 REM sleep behavior disorder: Secondary | ICD-10-CM | POA: Diagnosis not present

## 2024-06-16 DIAGNOSIS — M7918 Myalgia, other site: Secondary | ICD-10-CM | POA: Diagnosis not present

## 2024-06-16 DIAGNOSIS — G4721 Circadian rhythm sleep disorder, delayed sleep phase type: Secondary | ICD-10-CM | POA: Diagnosis not present

## 2024-06-26 ENCOUNTER — Ambulatory Visit (INDEPENDENT_AMBULATORY_CARE_PROVIDER_SITE_OTHER): Admitting: Otolaryngology

## 2024-06-26 ENCOUNTER — Encounter (INDEPENDENT_AMBULATORY_CARE_PROVIDER_SITE_OTHER): Payer: Self-pay | Admitting: Otolaryngology

## 2024-06-26 VITALS — BP 118/74 | HR 88 | Ht 67.0 in | Wt 190.0 lb

## 2024-06-26 DIAGNOSIS — Z91198 Patient's noncompliance with other medical treatment and regimen for other reason: Secondary | ICD-10-CM | POA: Diagnosis not present

## 2024-06-26 DIAGNOSIS — Z789 Other specified health status: Secondary | ICD-10-CM

## 2024-06-26 DIAGNOSIS — G4733 Obstructive sleep apnea (adult) (pediatric): Secondary | ICD-10-CM
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
## 2024-06-27 ENCOUNTER — Other Ambulatory Visit (HOSPITAL_BASED_OUTPATIENT_CLINIC_OR_DEPARTMENT_OTHER): Payer: Self-pay | Admitting: Family Medicine

## 2024-06-27 ENCOUNTER — Other Ambulatory Visit: Payer: Self-pay | Admitting: Cardiovascular Disease

## 2024-06-27 DIAGNOSIS — E559 Vitamin D deficiency, unspecified: Secondary | ICD-10-CM

## 2024-07-01 ENCOUNTER — Encounter (HOSPITAL_BASED_OUTPATIENT_CLINIC_OR_DEPARTMENT_OTHER): Payer: Self-pay

## 2024-07-03 ENCOUNTER — Encounter (HOSPITAL_BASED_OUTPATIENT_CLINIC_OR_DEPARTMENT_OTHER)
Admission: RE | Admit: 2024-07-03 | Discharge: 2024-07-03 | Disposition: A | Discharge status: Home / Self Care | Source: Ambulatory Visit | Attending: Otolaryngology | Admitting: Otolaryngology

## 2024-07-03 DIAGNOSIS — Z01812 Encounter for preprocedural laboratory examination: Secondary | ICD-10-CM | POA: Insufficient documentation

## 2024-07-03 LAB — BASIC METABOLIC PANEL WITH GFR
Anion gap: 9 (ref 5–15)
BUN: 13 mg/dL (ref 8–23)
CO2: 27 mmol/L (ref 22–32)
Calcium: 9.6 mg/dL (ref 8.9–10.3)
Chloride: 102 mmol/L (ref 98–111)
Creatinine, Ser: 1.14 mg/dL (ref 0.61–1.24)
GFR, Estimated: >60 mL/min (ref >60)
Glucose, Bld: 89 mg/dL (ref 70–99)
Potassium: 4.8 mmol/L (ref 3.5–5.1)
Sodium: 138 mmol/L (ref 135–145)

## 2024-07-06 DIAGNOSIS — F32A Depression, unspecified: Secondary | ICD-10-CM | POA: Diagnosis not present

## 2024-07-06 DIAGNOSIS — F419 Anxiety disorder, unspecified: Secondary | ICD-10-CM | POA: Diagnosis not present

## 2024-07-06 DIAGNOSIS — M069 Rheumatoid arthritis, unspecified: Secondary | ICD-10-CM | POA: Diagnosis not present

## 2024-07-06 DIAGNOSIS — Z9889 Other specified postprocedural states: Secondary | ICD-10-CM | POA: Diagnosis not present

## 2024-07-07 ENCOUNTER — Other Ambulatory Visit: Payer: Self-pay

## 2024-07-07 ENCOUNTER — Ambulatory Visit (HOSPITAL_BASED_OUTPATIENT_CLINIC_OR_DEPARTMENT_OTHER)
Admission: RE | Admit: 2024-07-07 | Discharge: 2024-07-07 | Disposition: A | Discharge status: Home / Self Care | Attending: Otolaryngology | Admitting: Otolaryngology

## 2024-07-07 ENCOUNTER — Telehealth (INDEPENDENT_AMBULATORY_CARE_PROVIDER_SITE_OTHER): Payer: Self-pay | Admitting: Otolaryngology

## 2024-07-07 ENCOUNTER — Ambulatory Visit (HOSPITAL_BASED_OUTPATIENT_CLINIC_OR_DEPARTMENT_OTHER): Admitting: Certified Registered Nurse Anesthetist

## 2024-07-07 ENCOUNTER — Encounter (HOSPITAL_BASED_OUTPATIENT_CLINIC_OR_DEPARTMENT_OTHER): Payer: Self-pay

## 2024-07-07 ENCOUNTER — Encounter (HOSPITAL_BASED_OUTPATIENT_CLINIC_OR_DEPARTMENT_OTHER)
Admission: RE | Disposition: A | Discharge status: Home / Self Care | Payer: Self-pay | Source: Home / Self Care | Attending: Otolaryngology

## 2024-07-07 DIAGNOSIS — Z79899 Other long term (current) drug therapy: Secondary | ICD-10-CM | POA: Insufficient documentation

## 2024-07-07 DIAGNOSIS — I2510 Atherosclerotic heart disease of native coronary artery without angina pectoris: Secondary | ICD-10-CM | POA: Diagnosis not present

## 2024-07-07 DIAGNOSIS — F419 Anxiety disorder, unspecified: Secondary | ICD-10-CM | POA: Insufficient documentation

## 2024-07-07 DIAGNOSIS — Z01818 Encounter for other preprocedural examination: Secondary | ICD-10-CM

## 2024-07-07 DIAGNOSIS — Z91198 Patient's noncompliance with other medical treatment and regimen for other reason: Secondary | ICD-10-CM

## 2024-07-07 DIAGNOSIS — G4733 Obstructive sleep apnea (adult) (pediatric): Secondary | ICD-10-CM | POA: Diagnosis not present

## 2024-07-07 DIAGNOSIS — Z87891 Personal history of nicotine dependence: Secondary | ICD-10-CM | POA: Insufficient documentation

## 2024-07-07 DIAGNOSIS — Z789 Other specified health status: Secondary | ICD-10-CM

## 2024-07-07 DIAGNOSIS — K219 Gastro-esophageal reflux disease without esophagitis: Secondary | ICD-10-CM | POA: Insufficient documentation

## 2024-07-07 DIAGNOSIS — E119 Type 2 diabetes mellitus without complications: Secondary | ICD-10-CM | POA: Insufficient documentation

## 2024-07-07 DIAGNOSIS — E039 Hypothyroidism, unspecified: Secondary | ICD-10-CM | POA: Diagnosis not present

## 2024-07-07 HISTORY — DX: Claustrophobia: F40.240

## 2024-07-07 HISTORY — DX: Atherosclerotic heart disease of native coronary artery without angina pectoris: I25.10

## 2024-07-07 HISTORY — PX: DRUG INDUCED ENDOSCOPY: SHX6808

## 2024-07-07 LAB — GLUCOSE, CAPILLARY
Glucose-Capillary: 92 mg/dL (ref 70–99)
Glucose-Capillary: 99 mg/dL (ref 70–99)

## 2024-07-07 SURGERY — DRUG INDUCED SLEEP ENDOSCOPY
Anesthesia: Monitor Anesthesia Care | Site: Nose

## 2024-07-07 MED ORDER — ONDANSETRON HCL 4 MG/2ML IJ SOLN: 4.0000 mg | Freq: Once | INTRAMUSCULAR | Status: DC | PRN

## 2024-07-07 MED ORDER — LACTATED RINGERS IV SOLN: INTRAVENOUS | Status: DC

## 2024-07-07 MED ORDER — LIDOCAINE 2% (20 MG/ML) 5 ML SYRINGE
INTRAMUSCULAR | Status: DC | PRN
  Administered 2024-07-07: 100 mg via INTRAVENOUS

## 2024-07-07 MED ORDER — OXYMETAZOLINE HCL 0.05 % NA SOLN
NASAL | Status: DC | PRN
  Administered 2024-07-07: 1 via TOPICAL

## 2024-07-07 MED ORDER — PROPOFOL 500 MG/50ML IV EMUL
INTRAVENOUS | Status: DC | PRN
  Administered 2024-07-07: 100 ug/kg/min via INTRAVENOUS

## 2024-07-07 MED ORDER — PROPOFOL 10 MG/ML IV BOLUS
INTRAVENOUS | Status: DC | PRN
  Administered 2024-07-07: 30 mg via INTRAVENOUS

## 2024-07-07 SURGICAL SUPPLY — 11 items
CANISTER SUCT 1200ML W/VALVE (MISCELLANEOUS) ×1 IMPLANT
GLOVE BIO SURGEON STRL SZ 6.5 (GLOVE) ×1 IMPLANT
KIT CLEAN ENDO (MISCELLANEOUS) ×1 IMPLANT
NDL PRECISIONGLIDE 27X1.5 (NEEDLE) IMPLANT
NEEDLE PRECISIONGLIDE 27X1.5 (NEEDLE) IMPLANT
PATTIES SURGICAL .5 X3 (DISPOSABLE) ×1 IMPLANT
SHEET MEDIUM DRAPE 40X70 STRL (DRAPES) ×1 IMPLANT
SOLUTION ANTFG W/FOAM PAD STRL (MISCELLANEOUS) ×1 IMPLANT
SYR CONTROL 10ML LL (SYRINGE) IMPLANT
TOWEL GREEN STERILE FF (TOWEL DISPOSABLE) ×1 IMPLANT
TUBE CONNECTING 20X1/4 (TUBING) IMPLANT

## 2024-07-07 NOTE — Anesthesia Preprocedure Evaluation (Addendum)
 Anesthesia Evaluation  Patient identified by MRN, date of birth, ID band Patient awake    Reviewed: Allergy & Precautions, NPO status , Patient's Chart, lab work & pertinent test results  History of Anesthesia Complications Negative for: history of anesthetic complications  Airway Mallampati: II  TM Distance: >3 FB Neck ROM: Full    Dental no notable dental hx. (+) Teeth Intact   Pulmonary sleep apnea , neg COPD, Patient abstained from smoking.Not current smoker, former smoker   Pulmonary exam normal breath sounds clear to auscultation       Cardiovascular Exercise Tolerance: Good METS(-) hypertension+ CAD  (-) Past MI (-) dysrhythmias  Rhythm:Regular Rate:Normal - Systolic murmurs    Neuro/Psych neg Seizures PSYCHIATRIC DISORDERS Anxiety     negative neurological ROS     GI/Hepatic ,GERD  Medicated and Controlled,,(+)     (-) substance abuse    Endo/Other  neg diabetesHypothyroidism    Renal/GU negative Renal ROS     Musculoskeletal   Abdominal   Peds  Hematology   Anesthesia Other Findings Past Medical History: No date: Allergy     Comment:  Pollen, dust 1994: Blood transfusion without reported diagnosis     Comment:  during surgery No date: Carpal tunnel syndrome No date: Coronary artery disease No date: COVID-19 12/11/2017: Diverticulosis     Comment:  By colonoscopy, Dr. Abran, 2015 No date: GAD (generalized anxiety disorder) No date: GERD (gastroesophageal reflux disease) No date: Heart murmur     Comment:  childhood No date: Hyperlipidemia No date: Lower urinary tract symptoms (LUTS) 09/11/2012: Medial meniscus tear No date: Obesity No date: Pre-diabetes No date: Seizures (HCC)     Comment:  childhood  Reproductive/Obstetrics                              Anesthesia Physical Anesthesia Plan  ASA: 2  Anesthesia Plan: MAC   Post-op Pain Management: Minimal or no  pain anticipated   Induction: Intravenous  PONV Risk Score and Plan: 1 and Propofol  infusion, TIVA and Ondansetron   Airway Management Planned: Nasal Cannula  Additional Equipment: None  Intra-op Plan:   Post-operative Plan:   Informed Consent: I have reviewed the patients History and Physical, chart, labs and discussed the procedure including the risks, benefits and alternatives for the proposed anesthesia with the patient or authorized representative who has indicated his/her understanding and acceptance.     Dental advisory given  Plan Discussed with: CRNA and Surgeon  Anesthesia Plan Comments: (Discussed risks of anesthesia with patient, including possibility of difficulty with spontaneous ventilation under anesthesia necessitating airway intervention, PONV, and rare risks such as cardiac or respiratory or neurological events, and allergic reactions. Discussed the role of CRNA in patient's perioperative care. Patient understands.)         Anesthesia Quick Evaluation

## 2024-07-07 NOTE — Transfer of Care (Signed)
 Immediate Anesthesia Transfer of Care Note  Patient: Ralph Phillips  Procedure(s) Performed: DRUG INDUCED SLEEP ENDOSCOPY (Nose)  Patient Location: PACU  Anesthesia Type:MAC  Level of Consciousness: awake, alert , and oriented  Airway & Oxygen Therapy: Patient Spontanous Breathing and Patient connected to nasal cannula oxygen  Post-op Assessment: Report given to RN and Post -op Vital signs reviewed and stable  Post vital signs: Reviewed and stable  Last Vitals:  Vitals Value Taken Time  BP 128/78 07/07/24 08:45  Temp 36.8 C 07/07/24 08:41  Pulse 63 07/07/24 08:52  Resp 12 07/07/24 08:52  SpO2 98 % 07/07/24 08:52  Vitals shown include unfiled device data.  Last Pain:  Vitals:   07/07/24 0841  TempSrc:   PainSc: 0-No pain      Patients Stated Pain Goal: 5 (07/07/24 0732)  Complications: No notable events documented.

## 2024-07-07 NOTE — Discharge Instructions (Addendum)
 You are a candidate for the Inspire Implant Here is more information about MRI Compatibility for the implant should you wish to discuss with your spine surgeon: [email or web address]

## 2024-07-07 NOTE — Op Note (Signed)
 Otolaryngology Operative note  Leo Fray Date/Time of Admission: 07/07/2024  6:58 AM  CSN: [id number];MRN:[id number]  DOB: November 25, 1960 Age: 63 y.o. Location: [facility]    Pre-Op Diagnosis: MODERATE TO SEVERE OBSTRUCTIVE SLEEP APNEA INTOLERANCE OF CONTINUOUS POSITIVE AIRWAY PRESSURE VENTILATION   Post-Op Diagnosis: Same   Procedure: Procedure(s): DRUG INDUCED SLEEP ENDOSCOPY USING ENID BREWSTER- CPT [phone]  Surgeon: Eldora Blanch, MD  Anesthesia type:  MAC  Anesthesiologist: Anesthesiologist: Boone Fess, MD CRNA: Leotha Andrez DEL, CRNA   Staff: Circulator: Elaine Avelina PARAS, RN Scrub Person: Florene Dagoberto MATSU, RN Vendor Representative : Candida Perkins  Implants: * No implants in log *  Specimens: * No specimens in log *  EBL: minimal  Drains: None  Post-op disposition and condition: PACU, hemodynamically stable  Findings: There was no evidence of complete concentric palatal obstruction and patient is a candidate anatomically for hypoglossal nerve stimulation therapy.    Complications: None apparent  Indications and consent:  Ralph Phillips is a 63 y.o. male with obstructive sleep apnea with intolerance of continuous positive airway pressure. As such, with a BMI of 29.7, patient's options were discussed including Hypoglossal nerve stimulator placement. Risks/benefits/alternatives for each option were discussed. Patient expressed understanding, and despite these risks, consented and decided to proceed with drug induced sleep endoscopy to determine stimulator placement candidacy. Informed consent was signed before proceeding.  Procedure: The patient was brought to the endoscopy room and was anesthetized via the standard drug-induced sleep endoscopy protocol using propofol  pump. The room lights were dimmed. The propofol  infusion rate was started at 50 mcg and gradually increased at which point, conditions that mimic sleep were  gradually observed.   With the patient not responsive to verbal commands, but still with spontaneous respiration, sleep disordered breathing events were clearly observed including modest snoring.   Under these conditions, the flexible endoscope was inserted to examine both sides of the nose as well as the pharynx and larynx. The palatal collapse was A-P with minimal lateral wall collapse (<25%). During some respirations, the uvula was noted to flop and contact posterior pharyngeal wall. With simulated jaw thrust, the hypopharyngeal obstruction and secondarily the palatal collapse improved.   In summary, there was no evidence of complete concentric palatal obstruction and patient is a candidate anatomically for hypoglossal nerve stimulation therapy.   The anesthesia was then weaned and care transferred to anesthesia who transported patient to PACU in stable condition.   I was present for and performed the entire procedure.   Kimara Bencomo B Maricia Scotti

## 2024-07-07 NOTE — Anesthesia Postprocedure Evaluation (Signed)
 Anesthesia Post Note  Patient: Ralph Phillips  Procedure(s) Performed: DRUG INDUCED SLEEP ENDOSCOPY (Nose)     Patient location during evaluation: Phase II Anesthesia Type: MAC Level of consciousness: awake and alert, oriented and patient cooperative Pain management: pain level controlled Vital Signs Assessment: post-procedure vital signs reviewed and stable Respiratory status: spontaneous breathing, nonlabored ventilation and respiratory function stable Cardiovascular status: stable and blood pressure returned to baseline Postop Assessment: no apparent nausea or vomiting, able to ambulate and adequate PO intake Anesthetic complications: no   No notable events documented.  Last Vitals:  Vitals:   07/07/24 0857 07/07/24 0906  BP: 132/84 (!) 141/81  Pulse: 65 65  Resp: 13 16  Temp:  36.8 C  SpO2: 97% 98%    Last Pain:  Vitals:   07/07/24 0906  TempSrc:   PainSc: 0-No pain                 Kealey Kemmer,E. Tylee Yum
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
## 2024-07-07 NOTE — Telephone Encounter (Signed)
 Passed DISE, will post for inspire Ralph Phillips

## 2024-07-08 ENCOUNTER — Encounter (HOSPITAL_BASED_OUTPATIENT_CLINIC_OR_DEPARTMENT_OTHER): Payer: Self-pay | Admitting: Otolaryngology

## 2024-07-09 DIAGNOSIS — M5136 Other intervertebral disc degeneration, lumbar region with discogenic back pain only: Secondary | ICD-10-CM | POA: Diagnosis not present

## 2024-07-09 DIAGNOSIS — M5416 Radiculopathy, lumbar region: Secondary | ICD-10-CM | POA: Diagnosis not present

## 2024-07-17 DIAGNOSIS — M545 Low back pain, unspecified: Secondary | ICD-10-CM | POA: Diagnosis not present

## 2024-07-27 DIAGNOSIS — M5416 Radiculopathy, lumbar region: Secondary | ICD-10-CM | POA: Diagnosis not present

## 2024-07-29 ENCOUNTER — Ambulatory Visit (INDEPENDENT_AMBULATORY_CARE_PROVIDER_SITE_OTHER): Admitting: Otolaryngology

## 2024-07-29 DIAGNOSIS — Z91198 Patient's noncompliance with other medical treatment and regimen for other reason: Secondary | ICD-10-CM | POA: Diagnosis not present

## 2024-07-29 DIAGNOSIS — G4733 Obstructive sleep apnea (adult) (pediatric): Secondary | ICD-10-CM | POA: Diagnosis not present

## 2024-07-29 DIAGNOSIS — Z789 Other specified health status: Secondary | ICD-10-CM

## 2024-08-03 ENCOUNTER — Encounter (INDEPENDENT_AMBULATORY_CARE_PROVIDER_SITE_OTHER): Payer: Self-pay | Admitting: Otolaryngology
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
## 2024-08-05 DIAGNOSIS — M5136 Other intervertebral disc degeneration, lumbar region with discogenic back pain only: Secondary | ICD-10-CM | POA: Diagnosis not present

## 2024-08-06 ENCOUNTER — Telehealth (INDEPENDENT_AMBULATORY_CARE_PROVIDER_SITE_OTHER): Payer: Self-pay | Admitting: Otolaryngology

## 2024-08-06 ENCOUNTER — Encounter (INDEPENDENT_AMBULATORY_CARE_PROVIDER_SITE_OTHER): Payer: Self-pay | Admitting: Otolaryngology

## 2024-08-06 DIAGNOSIS — G4733 Obstructive sleep apnea (adult) (pediatric): Secondary | ICD-10-CM | POA: Diagnosis not present

## 2024-08-06 DIAGNOSIS — Z9989 Dependence on other enabling machines and devices: Secondary | ICD-10-CM | POA: Diagnosis not present

## 2024-08-06 DIAGNOSIS — Z91198 Patient's noncompliance with other medical treatment and regimen for other reason: Secondary | ICD-10-CM | POA: Diagnosis not present

## 2024-08-06 MED ORDER — OXYCODONE HCL 5 MG PO TABS: 5.0000 mg | ORAL_TABLET | ORAL | 0 refills | Status: AC | PRN

## 2024-08-06 MED ORDER — ACETAMINOPHEN 500 MG PO TABS
1000.0000 mg | ORAL_TABLET | Freq: Four times a day (QID) | ORAL | 0 refills | Status: AC | PRN
Start: 2024-08-06 — End: ?

## 2024-08-06 MED ORDER — CEPHALEXIN 500 MG PO CAPS: 500.0000 mg | ORAL_CAPSULE | Freq: Two times a day (BID) | ORAL | 0 refills | Status: DC

## 2024-08-06 NOTE — Telephone Encounter (Signed)
 ENT Note: Right inspire done on 08/06/2024 at [facility]. Will eRx tylenol  and oxycodone and keflex 500mg  BID.  Incisions closed with dermabond. F/u scheduled

## 2024-08-11 ENCOUNTER — Telehealth (INDEPENDENT_AMBULATORY_CARE_PROVIDER_SITE_OTHER): Payer: Self-pay

## 2024-08-11 ENCOUNTER — Telehealth (INDEPENDENT_AMBULATORY_CARE_PROVIDER_SITE_OTHER): Payer: Self-pay | Admitting: Otolaryngology

## 2024-08-11 MED ORDER — PREDNISONE 20 MG PO TABS: 20.0000 mg | ORAL_TABLET | Freq: Every day | ORAL | 0 refills | Status: AC

## 2024-08-11 NOTE — Telephone Encounter (Signed)
 Pt called stated he is having issues and pain in his throat since surgery and needs Tobie or someone to call him back and what he needs to do.

## 2024-08-11 NOTE — Telephone Encounter (Signed)
 ENT Note: Patient called to report that he is having significant throat pain, particularly over right eye and throat. He has been using ice packs and it is not helpful. Chest and neck incision intact, no hematoma. Ralph Phillips

## 2024-08-12 ENCOUNTER — Ambulatory Visit (INDEPENDENT_AMBULATORY_CARE_PROVIDER_SITE_OTHER): Admitting: Otolaryngology

## 2024-08-12 ENCOUNTER — Encounter (INDEPENDENT_AMBULATORY_CARE_PROVIDER_SITE_OTHER): Payer: Self-pay | Admitting: Otolaryngology

## 2024-08-12 VITALS — BP 147/80 | HR 83 | Wt 194.0 lb

## 2024-08-12 DIAGNOSIS — Z9889 Other specified postprocedural states: Secondary | ICD-10-CM

## 2024-08-12 DIAGNOSIS — G4733 Obstructive sleep apnea (adult) (pediatric): Secondary | ICD-10-CM

## 2024-08-12 DIAGNOSIS — Z789 Other specified health status: Secondary | ICD-10-CM

## 2024-08-12 MED ORDER — CEPHALEXIN 500 MG PO CAPS: 500.0000 mg | ORAL_CAPSULE | Freq: Two times a day (BID) | ORAL | 0 refills | Status: AC

## 2024-08-12 NOTE — Patient Instructions (Signed)
 Take tylenol  1000mg  every 6 hours Take naproxen 500mg  twice daily Take oxycodone 5mg  every 4-6 hours

## 2024-08-12 NOTE — Progress Notes (Signed)
 S/p Hypoglossal nerve stimulator placement on 08/06/2024 S: Reports right neck swelling, pain, bruising, tenderness which is fairly significant. Swelling has slowly gone down. Tried calling office but no answer. Finally got through yesterday so added on today; no fevers. No tongue weakness, no lip weakness. No SOB. Chest incision noted to be doing well except bruising.  O: Tongue movement intact; no lower lip weakness; neck and chest incision c/d/I with surrounding bruising. Right submandibular area is tender and mildly firm, wonder if small hematoma present. No evidence of active infection A/P: 63 y.o. y.o. w/ OSA s/p Hypoglossal nerve stimulator placement on 08/06/2024 Wonder if small hematoma present but certainly no signs of infection Given hardware, though, will extend course of Keflex Recommend continued gentle neck rolls F/u in 1 week, return precautions discussed Ralph Phillips

## 2024-08-19 ENCOUNTER — Encounter (INDEPENDENT_AMBULATORY_CARE_PROVIDER_SITE_OTHER): Payer: Self-pay | Admitting: Otolaryngology

## 2024-08-19 ENCOUNTER — Ambulatory Visit (INDEPENDENT_AMBULATORY_CARE_PROVIDER_SITE_OTHER): Admitting: Otolaryngology

## 2024-08-19 VITALS — BP 120/79 | HR 93 | Ht 67.0 in | Wt 194.0 lb

## 2024-08-19 DIAGNOSIS — G4733 Obstructive sleep apnea (adult) (pediatric): Secondary | ICD-10-CM

## 2024-08-19 DIAGNOSIS — Z789 Other specified health status: Secondary | ICD-10-CM

## 2024-08-21 ENCOUNTER — Encounter (INDEPENDENT_AMBULATORY_CARE_PROVIDER_SITE_OTHER): Admitting: Otolaryngology

## 2024-08-28 ENCOUNTER — Encounter (INDEPENDENT_AMBULATORY_CARE_PROVIDER_SITE_OTHER): Payer: Self-pay | Admitting: Otolaryngology

## 2024-08-28 ENCOUNTER — Ambulatory Visit (INDEPENDENT_AMBULATORY_CARE_PROVIDER_SITE_OTHER): Admitting: Otolaryngology

## 2024-08-28 VITALS — BP 130/82 | HR 81 | Ht 67.0 in | Wt 194.0 lb

## 2024-08-28 DIAGNOSIS — G4733 Obstructive sleep apnea (adult) (pediatric): Secondary | ICD-10-CM

## 2024-08-28 DIAGNOSIS — Z789 Other specified health status: Secondary | ICD-10-CM

## 2024-08-28 NOTE — Patient Instructions (Addendum)
 Silicone scar cream - Scar away -- use twice per day for 3-6 months.

## 2024-08-28 NOTE — Progress Notes (Signed)
 S/p Hypoglossal nerve stimulator placement on 08/06/2024  S: Reports right neck swelling persists, but pain and bruising have improved. He worries about the bump on his neck. No tongue weakness, no lip weakness. No SOB. Chest incision noted to be doing well. O: Tongue movement intact; no lower lip weakness; neck and chest incision c/d/I. Right submandibular area is firm but this is likely dermal scar -- no evidence of infection or hematoma. Neck movement restriction right.  A/P: 63 y.o. y.o. w/ OSA s/p Hypoglossal nerve stimulator placement on 08/06/2024 No sign of infection Suspect part of swelling is submandibular gland prominence and part dermal scarring Recommend continuing neck rolls and neck movement Massage multiple times per day, can turn neck Return to all activities but soccer now, can play soccer 6 weeks after surgery Silicone scar gel twice a day for 3 months  F/u in 2 months -- explained some swelling/asymmetry of gland expected  Eldora KATHEE Blanch

## 2024-08-31 NOTE — Progress Notes (Signed)
 S/p Hypoglossal nerve stimulator placement on 08/06/2024 S: Reports right neck swelling, pain, bruising, tenderness is slowly improving; Swelling has slowly gone down. No fevers. No tongue weakness, no lip weakness. No SOB. Chest incision noted to be doing well  O: Tongue movement intact; no lower lip weakness; neck and chest incision c/d/I with surrounding bruising which is improved with mild induration and modest tenderness, no evidence of active infection.   A/P: 63 y.o. y.o. w/ OSA s/p Hypoglossal nerve stimulator placement on 08/06/2024 Wonder if small hematoma present but certainly no signs of infection today Slowly improving, so will observe Recommend continued gentle neck rolls F/u in 1 week, return precautions discussed  Eldora KATHEE Blanch

## 2024-09-04 ENCOUNTER — Ambulatory Visit: Admitting: Internal Medicine

## 2024-09-04 ENCOUNTER — Ambulatory Visit: Payer: Self-pay | Admitting: Internal Medicine

## 2024-09-04 ENCOUNTER — Encounter: Payer: Self-pay | Admitting: Internal Medicine

## 2024-09-04 VITALS — BP 120/86 | HR 85 | Temp 98.1°F | Ht 66.0 in | Wt 186.4 lb

## 2024-09-04 DIAGNOSIS — E039 Hypothyroidism, unspecified: Secondary | ICD-10-CM | POA: Diagnosis not present

## 2024-09-04 DIAGNOSIS — Z23 Encounter for immunization: Secondary | ICD-10-CM

## 2024-09-04 DIAGNOSIS — J302 Other seasonal allergic rhinitis: Secondary | ICD-10-CM | POA: Diagnosis not present

## 2024-09-04 DIAGNOSIS — N401 Enlarged prostate with lower urinary tract symptoms: Secondary | ICD-10-CM

## 2024-09-04 DIAGNOSIS — E559 Vitamin D deficiency, unspecified: Secondary | ICD-10-CM | POA: Diagnosis not present

## 2024-09-04 DIAGNOSIS — Z0000 Encounter for general adult medical examination without abnormal findings: Secondary | ICD-10-CM | POA: Diagnosis not present

## 2024-09-04 DIAGNOSIS — Z0100 Encounter for examination of eyes and vision without abnormal findings: Secondary | ICD-10-CM

## 2024-09-04 DIAGNOSIS — E291 Testicular hypofunction: Secondary | ICD-10-CM

## 2024-09-04 DIAGNOSIS — Z1211 Encounter for screening for malignant neoplasm of colon: Secondary | ICD-10-CM

## 2024-09-04 DIAGNOSIS — R3916 Straining to void: Secondary | ICD-10-CM

## 2024-09-04 DIAGNOSIS — Z125 Encounter for screening for malignant neoplasm of prostate: Secondary | ICD-10-CM

## 2024-09-04 DIAGNOSIS — E1169 Type 2 diabetes mellitus with other specified complication: Secondary | ICD-10-CM

## 2024-09-04 DIAGNOSIS — R7303 Prediabetes: Secondary | ICD-10-CM | POA: Diagnosis not present

## 2024-09-04 DIAGNOSIS — E782 Mixed hyperlipidemia: Secondary | ICD-10-CM | POA: Diagnosis not present

## 2024-09-04 LAB — COMPREHENSIVE METABOLIC PANEL WITH GFR
ALT: 17 U/L (ref 0–53)
AST: 16 U/L (ref 0–37)
Albumin: 4.7 g/dL (ref 3.5–5.2)
Alkaline Phosphatase: 55 U/L (ref 39–117)
BUN: 15 mg/dL (ref 6–23)
CO2: 27 meq/L (ref 19–32)
Calcium: 9.3 mg/dL (ref 8.4–10.5)
Chloride: 102 meq/L (ref 96–112)
Creatinine, Ser: 1.16 mg/dL (ref 0.40–1.50)
GFR: 68.04 mL/min (ref >60.00)
Glucose, Bld: 86 mg/dL (ref 70–99)
Potassium: 4.1 meq/L (ref 3.5–5.1)
Sodium: 140 meq/L (ref 135–145)
Total Bilirubin: 0.5 mg/dL (ref 0.2–1.2)
Total Protein: 7.3 g/dL (ref 6.0–8.3)

## 2024-09-04 LAB — CBC WITH DIFFERENTIAL/PLATELET
Basophils Absolute: 0 K/uL (ref 0.0–0.1)
Basophils Relative: 0.9 % (ref 0.0–3.0)
Eosinophils Absolute: 0.2 K/uL (ref 0.0–0.7)
Eosinophils Relative: 3.2 % (ref 0.0–5.0)
HCT: 42.6 % (ref 39.0–52.0)
Hemoglobin: 14.4 g/dL (ref 13.0–17.0)
Lymphocytes Relative: 37.6 % (ref 12.0–46.0)
Lymphs Abs: 1.9 K/uL (ref 0.7–4.0)
MCHC: 33.9 g/dL (ref 30.0–36.0)
MCV: 92.7 fl (ref 78.0–100.0)
Monocytes Absolute: 0.4 K/uL (ref 0.1–1.0)
Monocytes Relative: 9 % (ref 3.0–12.0)
Neutro Abs: 2.4 K/uL (ref 1.4–7.7)
Neutrophils Relative %: 49.3 % (ref 43.0–77.0)
Platelets: 140 K/uL — ABNORMAL LOW (ref 150.0–400.0)
RBC: 4.59 Mil/uL (ref 4.22–5.81)
RDW: 14.3 % (ref 11.5–15.5)
WBC: 5 K/uL (ref 4.0–10.5)

## 2024-09-04 LAB — LIPID PANEL
Cholesterol: 153 mg/dL (ref 0–200)
HDL: 58.2 mg/dL (ref >39.00)
LDL Cholesterol: 71 mg/dL (ref 0–99)
NonHDL: 94.47
Total CHOL/HDL Ratio: 3
Triglycerides: 118 mg/dL (ref 0.0–149.0)
VLDL: 23.6 mg/dL (ref 0.0–40.0)

## 2024-09-04 LAB — HEMOGLOBIN A1C: Hgb A1c MFr Bld: 6.5 % (ref 4.6–6.5)

## 2024-09-04 LAB — VITAMIN D 25 HYDROXY (VIT D DEFICIENCY, FRACTURES): VITD: 32.2 ng/mL (ref 30.00–100.00)

## 2024-09-04 LAB — PSA: PSA: 2.32 ng/mL (ref 0.10–4.00)

## 2024-09-04 LAB — TSH: TSH: 4.62 u[IU]/mL (ref 0.35–5.50)

## 2024-09-04 LAB — VITAMIN B12: Vitamin B-12: 581 pg/mL (ref 211–911)

## 2024-09-04 MED ORDER — LEVOCETIRIZINE DIHYDROCHLORIDE 5 MG PO TABS: 5.0000 mg | ORAL_TABLET | Freq: Every evening | ORAL | 1 refills | Status: AC

## 2024-09-04 MED ORDER — FLUTICASONE PROPIONATE 50 MCG/ACT NA SUSP: 2.0000 | Freq: Every day | NASAL | 1 refills | Status: DC
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
## 2024-09-04 NOTE — Addendum Note (Signed)
 Addended by: KATHRYNE MILLMAN B on: 09/04/2024 12:03 PM   Modules accepted: Orders

## 2024-09-08 DIAGNOSIS — M5416 Radiculopathy, lumbar region: Secondary | ICD-10-CM | POA: Diagnosis not present

## 2024-09-08 DIAGNOSIS — M5412 Radiculopathy, cervical region: Secondary | ICD-10-CM | POA: Diagnosis not present

## 2024-09-16 DIAGNOSIS — M5416 Radiculopathy, lumbar region: Secondary | ICD-10-CM | POA: Diagnosis not present

## 2024-09-23 DIAGNOSIS — M542 Cervicalgia: Secondary | ICD-10-CM | POA: Diagnosis not present

## 2024-09-23 DIAGNOSIS — M4316 Spondylolisthesis, lumbar region: Secondary | ICD-10-CM | POA: Diagnosis not present

## 2024-10-01 DIAGNOSIS — G4752 REM sleep behavior disorder: Secondary | ICD-10-CM | POA: Diagnosis not present

## 2024-10-01 DIAGNOSIS — Z4542 Encounter for adjustment and management of neuropacemaker (brain) (peripheral nerve) (spinal cord): Secondary | ICD-10-CM | POA: Diagnosis not present

## 2024-10-01 DIAGNOSIS — G4721 Circadian rhythm sleep disorder, delayed sleep phase type: Secondary | ICD-10-CM | POA: Diagnosis not present

## 2024-10-01 DIAGNOSIS — M7918 Myalgia, other site: Secondary | ICD-10-CM | POA: Diagnosis not present

## 2024-10-01 DIAGNOSIS — G4733 Obstructive sleep apnea (adult) (pediatric): Secondary | ICD-10-CM | POA: Diagnosis not present

## 2024-10-02 DIAGNOSIS — H35412 Lattice degeneration of retina, left eye: Secondary | ICD-10-CM | POA: Diagnosis not present

## 2024-10-02 DIAGNOSIS — H43813 Vitreous degeneration, bilateral: Secondary | ICD-10-CM | POA: Diagnosis not present

## 2024-10-02 DIAGNOSIS — H5712 Ocular pain, left eye: Secondary | ICD-10-CM | POA: Diagnosis not present

## 2024-10-02 DIAGNOSIS — H2513 Age-related nuclear cataract, bilateral: Secondary | ICD-10-CM | POA: Diagnosis not present

## 2024-10-03 NOTE — Progress Notes (Signed)
 Mikiah Demond                                          MRN: [id number]   10/03/2024   The [facility] Quality Team Specialist reviewed this patient medical record for the purposes of chart review for care gap closure. The following were reviewed: abstraction for care gap closure-glycemic status assessment. Chart also reviewed for KED and EED.    [facility] Quality Team

## 2024-10-05 ENCOUNTER — Other Ambulatory Visit: Payer: Self-pay | Admitting: Internal Medicine

## 2024-10-05 DIAGNOSIS — J302 Other seasonal allergic rhinitis: Secondary | ICD-10-CM

## 2024-10-21 ENCOUNTER — Other Ambulatory Visit: Payer: Self-pay | Admitting: Internal Medicine

## 2024-10-29 ENCOUNTER — Telehealth (INDEPENDENT_AMBULATORY_CARE_PROVIDER_SITE_OTHER): Payer: Self-pay | Admitting: Otolaryngology

## 2024-10-29 ENCOUNTER — Encounter: Payer: Self-pay | Admitting: Cardiovascular Disease

## 2024-10-29 NOTE — Telephone Encounter (Signed)
 Patient called and cancelled his appointment on 10/30/2024 for a 2 month follow up appt after Inspire Sx due to insurance change.  Patient now has Physiological scientist with [facility] and [employer] is Out of Network.  I was not sure in this case, if there is a Provider the patient should be referred to or a recommendation you have for a Provider that is in network with his insurance so that he can continue to receive care.  Patient can be reached at [phone].

## 2024-10-30 ENCOUNTER — Ambulatory Visit (INDEPENDENT_AMBULATORY_CARE_PROVIDER_SITE_OTHER): Admitting: Otolaryngology

## 2024-11-26 ENCOUNTER — Other Ambulatory Visit: Payer: Self-pay | Admitting: Internal Medicine

## 2024-11-26 DIAGNOSIS — J302 Other seasonal allergic rhinitis: Secondary | ICD-10-CM
